# Patient Record
Sex: Female | Born: 1975 | Race: Black or African American | Hispanic: No | Marital: Married | Smoking: Never smoker
Health system: Southern US, Community
[De-identification: ages and names within clinical notes are randomized; demographics above are authoritative.]

## PROBLEM LIST (undated history)

## (undated) DIAGNOSIS — N390 Urinary tract infection, site not specified: Secondary | ICD-10-CM

## (undated) DIAGNOSIS — D649 Anemia, unspecified: Secondary | ICD-10-CM

## (undated) DIAGNOSIS — T7840XA Allergy, unspecified, initial encounter: Secondary | ICD-10-CM

## (undated) DIAGNOSIS — Z87898 Personal history of other specified conditions: Secondary | ICD-10-CM

## (undated) DIAGNOSIS — F32A Depression, unspecified: Secondary | ICD-10-CM

## (undated) DIAGNOSIS — G47419 Narcolepsy without cataplexy: Secondary | ICD-10-CM

## (undated) DIAGNOSIS — G478 Other sleep disorders: Secondary | ICD-10-CM

## (undated) DIAGNOSIS — F329 Major depressive disorder, single episode, unspecified: Secondary | ICD-10-CM

## (undated) HISTORY — PX: WISDOM TOOTH EXTRACTION: SHX21

## (undated) HISTORY — DX: Urinary tract infection, site not specified: N39.0

## (undated) HISTORY — DX: Depression, unspecified: F32.A

## (undated) HISTORY — DX: Major depressive disorder, single episode, unspecified: F32.9

## (undated) HISTORY — DX: Allergy, unspecified, initial encounter: T78.40XA

## (undated) HISTORY — DX: Personal history of other specified conditions: Z87.898

## (undated) HISTORY — DX: Anemia, unspecified: D64.9

---

## 1999-08-04 ENCOUNTER — Other Ambulatory Visit: Admission: RE | Admit: 1999-08-04 | Discharge: 1999-08-04 | Payer: Self-pay | Admitting: *Deleted

## 2000-07-07 ENCOUNTER — Emergency Department (HOSPITAL_COMMUNITY): Admission: EM | Admit: 2000-07-07 | Discharge: 2000-07-08 | Payer: Self-pay | Admitting: Emergency Medicine

## 2000-07-09 ENCOUNTER — Encounter: Admission: RE | Admit: 2000-07-09 | Discharge: 2000-07-09 | Payer: Self-pay | Admitting: Internal Medicine

## 2000-10-08 ENCOUNTER — Other Ambulatory Visit: Admission: RE | Admit: 2000-10-08 | Discharge: 2000-10-08 | Payer: Self-pay | Admitting: Obstetrics and Gynecology

## 2001-04-20 ENCOUNTER — Inpatient Hospital Stay (HOSPITAL_COMMUNITY): Admission: AD | Admit: 2001-04-20 | Discharge: 2001-04-20 | Payer: Self-pay | Admitting: Obstetrics and Gynecology

## 2001-04-22 ENCOUNTER — Inpatient Hospital Stay (HOSPITAL_COMMUNITY): Admission: AD | Admit: 2001-04-22 | Discharge: 2001-04-24 | Payer: Self-pay | Admitting: Obstetrics and Gynecology

## 2001-06-15 ENCOUNTER — Encounter: Payer: Self-pay | Admitting: Emergency Medicine

## 2001-06-15 ENCOUNTER — Emergency Department (HOSPITAL_COMMUNITY): Admission: EM | Admit: 2001-06-15 | Discharge: 2001-06-15 | Payer: Self-pay | Admitting: Emergency Medicine

## 2001-11-29 ENCOUNTER — Other Ambulatory Visit: Admission: RE | Admit: 2001-11-29 | Discharge: 2001-11-29 | Payer: Self-pay | Admitting: Obstetrics and Gynecology

## 2002-12-16 ENCOUNTER — Other Ambulatory Visit: Admission: RE | Admit: 2002-12-16 | Discharge: 2002-12-16 | Payer: Self-pay | Admitting: Family Medicine

## 2003-08-04 ENCOUNTER — Emergency Department (HOSPITAL_COMMUNITY): Admission: AD | Admit: 2003-08-04 | Discharge: 2003-08-04 | Payer: Self-pay | Admitting: Family Medicine

## 2003-10-19 ENCOUNTER — Emergency Department (HOSPITAL_COMMUNITY): Admission: AD | Admit: 2003-10-19 | Discharge: 2003-10-19 | Payer: Self-pay | Admitting: Family Medicine

## 2004-01-26 ENCOUNTER — Ambulatory Visit (HOSPITAL_COMMUNITY): Admission: RE | Admit: 2004-01-26 | Discharge: 2004-01-26 | Payer: Self-pay | Admitting: *Deleted

## 2004-02-01 ENCOUNTER — Other Ambulatory Visit: Admission: RE | Admit: 2004-02-01 | Discharge: 2004-02-01 | Payer: Self-pay | Admitting: Family Medicine

## 2004-02-08 ENCOUNTER — Emergency Department (HOSPITAL_COMMUNITY): Admission: EM | Admit: 2004-02-08 | Discharge: 2004-02-08 | Payer: Self-pay | Admitting: Family Medicine

## 2004-02-23 ENCOUNTER — Emergency Department (HOSPITAL_COMMUNITY): Admission: EM | Admit: 2004-02-23 | Discharge: 2004-02-23 | Payer: Self-pay | Admitting: *Deleted

## 2004-02-26 ENCOUNTER — Emergency Department (HOSPITAL_COMMUNITY): Admission: EM | Admit: 2004-02-26 | Discharge: 2004-02-26 | Payer: Self-pay | Admitting: Family Medicine

## 2004-02-28 ENCOUNTER — Emergency Department (HOSPITAL_COMMUNITY): Admission: EM | Admit: 2004-02-28 | Discharge: 2004-02-28 | Payer: Self-pay | Admitting: Family Medicine

## 2005-05-09 ENCOUNTER — Emergency Department (HOSPITAL_COMMUNITY): Admission: EM | Admit: 2005-05-09 | Discharge: 2005-05-09 | Payer: Self-pay | Admitting: Family Medicine

## 2005-11-07 ENCOUNTER — Emergency Department (HOSPITAL_COMMUNITY): Admission: EM | Admit: 2005-11-07 | Discharge: 2005-11-07 | Payer: Self-pay | Admitting: Family Medicine

## 2006-02-19 ENCOUNTER — Emergency Department (HOSPITAL_COMMUNITY): Admission: EM | Admit: 2006-02-19 | Discharge: 2006-02-19 | Payer: Self-pay | Admitting: Family Medicine

## 2007-01-14 ENCOUNTER — Encounter (INDEPENDENT_AMBULATORY_CARE_PROVIDER_SITE_OTHER): Payer: Self-pay | Admitting: Family Medicine

## 2007-01-14 ENCOUNTER — Ambulatory Visit: Payer: Self-pay | Admitting: Family Medicine

## 2008-03-31 ENCOUNTER — Ambulatory Visit: Payer: Self-pay | Admitting: Family Medicine

## 2008-03-31 DIAGNOSIS — G4710 Hypersomnia, unspecified: Secondary | ICD-10-CM

## 2008-03-31 DIAGNOSIS — R5381 Other malaise: Secondary | ICD-10-CM | POA: Insufficient documentation

## 2008-03-31 DIAGNOSIS — R5383 Other fatigue: Secondary | ICD-10-CM

## 2008-04-01 ENCOUNTER — Encounter (INDEPENDENT_AMBULATORY_CARE_PROVIDER_SITE_OTHER): Payer: Self-pay | Admitting: Family Medicine

## 2008-04-01 LAB — CONVERTED CEMR LAB
ALT: 9 units/L (ref 0–35)
AST: 12 units/L (ref 0–37)
Albumin: 4.6 g/dL (ref 3.5–5.2)
Alkaline Phosphatase: 69 units/L (ref 39–117)
BUN: 10 mg/dL (ref 6–23)
Basophils Absolute: 0 10*3/uL (ref 0.0–0.1)
Basophils Relative: 0 % (ref 0–1)
CO2: 24 meq/L (ref 19–32)
Calcium: 8.9 mg/dL (ref 8.4–10.5)
Chloride: 104 meq/L (ref 96–112)
Creatinine, Ser: 1 mg/dL (ref 0.40–1.20)
Eosinophils Absolute: 0.1 10*3/uL (ref 0.0–0.7)
Eosinophils Relative: 1 % (ref 0–5)
Glucose, Bld: 76 mg/dL (ref 70–99)
HCT: 42.8 % (ref 36.0–46.0)
Hemoglobin: 13.3 g/dL (ref 12.0–15.0)
Lymphocytes Relative: 31 % (ref 12–46)
Lymphs Abs: 2.4 10*3/uL (ref 0.7–4.0)
MCHC: 31.1 g/dL (ref 30.0–36.0)
MCV: 85.3 fL (ref 78.0–100.0)
Monocytes Absolute: 0.5 10*3/uL (ref 0.1–1.0)
Monocytes Relative: 7 % (ref 3–12)
Neutro Abs: 4.7 10*3/uL (ref 1.7–7.7)
Neutrophils Relative %: 62 % (ref 43–77)
Platelets: 337 10*3/uL (ref 150–400)
Potassium: 3.6 meq/L (ref 3.5–5.3)
RBC: 5.02 M/uL (ref 3.87–5.11)
RDW: 13.7 % (ref 11.5–15.5)
Sodium: 140 meq/L (ref 135–145)
TSH: 0.973 microintl units/mL (ref 0.350–4.50)
Total Bilirubin: 0.6 mg/dL (ref 0.3–1.2)
Total Protein: 7.1 g/dL (ref 6.0–8.3)
WBC: 7.7 10*3/uL (ref 4.0–10.5)

## 2008-04-08 ENCOUNTER — Encounter (INDEPENDENT_AMBULATORY_CARE_PROVIDER_SITE_OTHER): Payer: Self-pay | Admitting: Family Medicine

## 2008-04-08 ENCOUNTER — Ambulatory Visit (HOSPITAL_BASED_OUTPATIENT_CLINIC_OR_DEPARTMENT_OTHER): Admission: RE | Admit: 2008-04-08 | Discharge: 2008-04-08 | Payer: Self-pay | Admitting: Family Medicine

## 2008-04-11 ENCOUNTER — Ambulatory Visit: Payer: Self-pay | Admitting: Internal Medicine

## 2008-07-20 ENCOUNTER — Ambulatory Visit: Payer: Self-pay | Admitting: Family Medicine

## 2008-07-20 ENCOUNTER — Other Ambulatory Visit: Admission: RE | Admit: 2008-07-20 | Discharge: 2008-07-20 | Payer: Self-pay | Admitting: Family Medicine

## 2008-07-20 ENCOUNTER — Encounter (INDEPENDENT_AMBULATORY_CARE_PROVIDER_SITE_OTHER): Payer: Self-pay | Admitting: Family Medicine

## 2008-07-20 LAB — CONVERTED CEMR LAB
ALT: 13 units/L (ref 0–35)
AST: 18 units/L (ref 0–37)
Albumin: 4.5 g/dL (ref 3.5–5.2)
Alkaline Phosphatase: 66 units/L (ref 39–117)
BUN: 9 mg/dL (ref 6–23)
Basophils Absolute: 0 10*3/uL (ref 0.0–0.1)
Basophils Relative: 0 % (ref 0–1)
CO2: 24 meq/L (ref 19–32)
Calcium: 9.5 mg/dL (ref 8.4–10.5)
Chloride: 103 meq/L (ref 96–112)
Cholesterol: 180 mg/dL (ref 0–200)
Creatinine, Ser: 0.96 mg/dL (ref 0.40–1.20)
Eosinophils Absolute: 0.1 10*3/uL (ref 0.0–0.7)
Eosinophils Relative: 2 % (ref 0–5)
Glucose, Bld: 74 mg/dL (ref 70–99)
HCT: 41.3 % (ref 36.0–46.0)
HDL: 60 mg/dL (ref >39)
Hemoglobin: 12.8 g/dL (ref 12.0–15.0)
LDL Cholesterol: 106 mg/dL — ABNORMAL HIGH (ref 0–99)
Lymphocytes Relative: 46 % (ref 12–46)
Lymphs Abs: 3.3 10*3/uL (ref 0.7–4.0)
MCHC: 31 g/dL (ref 30.0–36.0)
MCV: 85.3 fL (ref 78.0–100.0)
Monocytes Absolute: 0.4 10*3/uL (ref 0.1–1.0)
Monocytes Relative: 6 % (ref 3–12)
Neutro Abs: 3.4 10*3/uL (ref 1.7–7.7)
Neutrophils Relative %: 47 % (ref 43–77)
Platelets: 323 10*3/uL (ref 150–400)
Potassium: 4.3 meq/L (ref 3.5–5.3)
RBC: 4.84 M/uL (ref 3.87–5.11)
RDW: 13.5 % (ref 11.5–15.5)
Sodium: 143 meq/L (ref 135–145)
TSH: 1.404 microintl units/mL (ref 0.350–4.50)
Total Bilirubin: 0.5 mg/dL (ref 0.3–1.2)
Total CHOL/HDL Ratio: 3
Total Protein: 7.1 g/dL (ref 6.0–8.3)
Triglycerides: 69 mg/dL (ref <150)
VLDL: 14 mg/dL (ref 0–40)
WBC: 7.2 10*3/uL (ref 4.0–10.5)

## 2008-07-27 ENCOUNTER — Encounter (INDEPENDENT_AMBULATORY_CARE_PROVIDER_SITE_OTHER): Payer: Self-pay | Admitting: Family Medicine

## 2008-08-04 ENCOUNTER — Telehealth (INDEPENDENT_AMBULATORY_CARE_PROVIDER_SITE_OTHER): Payer: Self-pay | Admitting: *Deleted

## 2008-11-17 ENCOUNTER — Telehealth (INDEPENDENT_AMBULATORY_CARE_PROVIDER_SITE_OTHER): Payer: Self-pay | Admitting: *Deleted

## 2008-11-26 ENCOUNTER — Encounter (INDEPENDENT_AMBULATORY_CARE_PROVIDER_SITE_OTHER): Payer: Self-pay | Admitting: Family Medicine

## 2008-12-03 ENCOUNTER — Encounter (INDEPENDENT_AMBULATORY_CARE_PROVIDER_SITE_OTHER): Payer: Self-pay | Admitting: Family Medicine

## 2008-12-30 ENCOUNTER — Ambulatory Visit (HOSPITAL_BASED_OUTPATIENT_CLINIC_OR_DEPARTMENT_OTHER): Admission: RE | Admit: 2008-12-30 | Discharge: 2008-12-30 | Payer: Self-pay | Admitting: Neurology

## 2009-01-27 DIAGNOSIS — G47429 Narcolepsy in conditions classified elsewhere without cataplexy: Secondary | ICD-10-CM

## 2009-02-01 ENCOUNTER — Ambulatory Visit: Payer: Self-pay | Admitting: Pulmonary Disease

## 2009-02-03 ENCOUNTER — Encounter (INDEPENDENT_AMBULATORY_CARE_PROVIDER_SITE_OTHER): Payer: Self-pay | Admitting: Family Medicine

## 2009-02-05 ENCOUNTER — Encounter (INDEPENDENT_AMBULATORY_CARE_PROVIDER_SITE_OTHER): Payer: Self-pay | Admitting: Family Medicine

## 2009-02-09 ENCOUNTER — Encounter (INDEPENDENT_AMBULATORY_CARE_PROVIDER_SITE_OTHER): Payer: Self-pay | Admitting: Family Medicine

## 2009-11-09 ENCOUNTER — Emergency Department (HOSPITAL_COMMUNITY): Admission: EM | Admit: 2009-11-09 | Discharge: 2009-11-10 | Payer: Self-pay | Admitting: Emergency Medicine

## 2010-02-23 ENCOUNTER — Emergency Department (HOSPITAL_COMMUNITY): Admission: EM | Admit: 2010-02-23 | Discharge: 2010-02-23 | Payer: Self-pay | Admitting: Family Medicine

## 2010-03-10 ENCOUNTER — Ambulatory Visit: Payer: Self-pay | Admitting: Physician Assistant

## 2010-03-20 ENCOUNTER — Emergency Department (HOSPITAL_COMMUNITY): Admission: EM | Admit: 2010-03-20 | Discharge: 2010-03-20 | Payer: Self-pay | Admitting: Emergency Medicine

## 2010-04-04 ENCOUNTER — Encounter: Payer: Self-pay | Admitting: Physician Assistant

## 2010-04-05 ENCOUNTER — Other Ambulatory Visit: Admission: RE | Admit: 2010-04-05 | Discharge: 2010-04-05 | Payer: Self-pay | Admitting: Internal Medicine

## 2010-04-05 ENCOUNTER — Ambulatory Visit: Payer: Self-pay | Admitting: Physician Assistant

## 2010-04-05 DIAGNOSIS — R82998 Other abnormal findings in urine: Secondary | ICD-10-CM | POA: Insufficient documentation

## 2010-04-05 LAB — CONVERTED CEMR LAB
KOH Prep: NEGATIVE
Nitrite: NEGATIVE
Urobilinogen, UA: 0.2
WBC Urine, dipstick: NEGATIVE
pH: 6.5

## 2010-04-08 DIAGNOSIS — B373 Candidiasis of vulva and vagina: Secondary | ICD-10-CM

## 2010-04-12 ENCOUNTER — Ambulatory Visit (HOSPITAL_COMMUNITY): Admission: RE | Admit: 2010-04-12 | Discharge: 2010-04-12 | Payer: Self-pay | Admitting: Internal Medicine

## 2010-04-19 ENCOUNTER — Encounter: Payer: Self-pay | Admitting: Physician Assistant

## 2010-04-26 ENCOUNTER — Ambulatory Visit: Payer: Self-pay | Admitting: Physician Assistant

## 2010-04-26 LAB — CONVERTED CEMR LAB
Blood in Urine, dipstick: NEGATIVE
Protein, U semiquant: NEGATIVE
Urobilinogen, UA: 0.2
WBC Urine, dipstick: NEGATIVE

## 2010-10-18 NOTE — Progress Notes (Signed)
Summary: Office Visit//DEPRESSION SCREENING  Office Visit//DEPRESSION SCREENING   Imported By: Arta Bruce 04/11/2010 12:34:04  _____________________________________________________________________  External Attachment:    Type:   Image     Comment:   External Document

## 2010-10-18 NOTE — Letter (Signed)
Summary: *HSN Results Follow up  [facility]  [address] [place], [place] [address]   Phone: [phone]  Fax: [phone]      04/26/2010   [facility] [address] [place], [place]  [address]   Dear  Ms. Jane Eaton,                            ____S.Drinkard,FNP   ____D. Gore,FNP       ____B. McPherson,MD   ____V. Rankins,MD    ____E. Mulberry,MD    ____N. Daphine Deutscher, FNP  ____D. Reche Dixon, MD    ____K. Philipp Deputy, MD    __x__S. Alben Spittle, PA-C     This letter is to inform you that your recent test(s):  _______Pap Smear    ____x___Lab Test     _______X-ray    ___x____ is within acceptable limits  _______ requires a medication change  _______ requires a follow-up lab visit  _______ requires a follow-up visit with your provider   Comments:  Urinalysis was normal.       _________________________________________________________ If you have any questions, please contact our office                     Sincerely,  Tereso Newcomer PA-C [facility]

## 2010-10-18 NOTE — Assessment & Plan Note (Signed)
Summary: Establish with Jane Eaton   Vital Signs:  Patient profile:   35 year old female Height:      60.5 inches Weight:      128 pounds BMI:     24.68 Temp:     98 degrees F oral Pulse rate:   88 / minute Pulse rhythm:   regular Resp:     16 per minute BP sitting:   116 / 69  (left arm) Cuff size:   regular  Vitals Entered By: Arm[place] Shannon (March 10, 2010 2:33 PM) CC: appointment to establish new physician Is Patient Diabetic? No Pain Assessment Patient in pain? no       Does patient need assistance? Functional Status Self care Ambulation Normal   Primary Care Provider:  Tereso Newcomer, PA-C  CC:  appointment to establish new physician.  History of Present Illness: Here to meet with me.  Prior patient of Dr. Barbaraann Barthel.  Narcolepsy:  Supposed to see Dr. Vickey Huger this year.  But, no f/u made yet.  Having trouble with their office and scheduling.  She has not had her medicine refilled either.  Health Maint:   States IUD fell out recently.  Placed in 2008 (good for 5 years) - Mirena She describes the IUD. Had some vaginal bleeding for 5 days.  No further bleeding. Td  shot done 2004  Denies any cramping, bleeding or discharge.  No fevers or chills. She is thinking about getting pregnant now that her IUD has fallen out.   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 Take 1/2 Tablet By Mouth  Every Morning and 1/2 Tablet As Needed Pm (Prescribed Per Dr.dohmeier).  Allergies (verified): No Known Drug Allergies  Past History:  Past Medical History: Vitiligo  04/09/07 Hirsuitism sees dr.Lupton Narcolepsy   a.  Dr. Vickey Huger h/o anemia ? h/o seizures as a child (followed by neuro)  Past Surgical History: Caesarean section (2001) 4 wisdom teeth extracted  Family History: Family History Diabetes 1st degree relative CAD - father died with MI age 61 No colon, breast or ovarian cancer  Social History: Married 2 kids Never Smoked Alcohol use-yes;rare use 1x/mo Drug use-no Works at Insurance underwriter at [employer] [place]  Review of Systems      See HPI General:  Denies chills and fever. CV:  Denies chest pain or discomfort. Resp:  Denies cough.  Physical Exam  General:  alert, well-developed, and well-nourished.   Head:  normocephalic and atraumatic.   Neck:  supple.   Lungs:  normal breath sounds.   Heart:  normal rate and regular rhythm.   Abdomen:  soft and non-tender.   Extremities:  no edema  Neurologic:  alert & oriented X3 and cranial nerves II-XII intact.   Psych:  normally interactive and good eye contact.     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 Take 1/2 tablet by mouth  every morning and 1/2 tablet as needed pm (prescribed per dr.dohmeier).  Patient Instructions: 1)  Please schedule a follow-up appointment in 3-4 weeks for CPP.

## 2010-10-18 NOTE — Letter (Signed)
Summary: TEST ORDER FORM/ULTRASOUND  TEST ORDER FORM/ULTRASOUND   Imported By: Arta Bruce 04/12/2010 10:15:54  _____________________________________________________________________  External Attachment:    Type:   Image     Comment:   External Document

## 2010-10-18 NOTE — Assessment & Plan Note (Signed)
Summary: CPP   Vital Signs:  Patient profile:   35 year old female Height:      60.5 inches Weight:      130 pounds BMI:     25.06 Temp:     98.1 degrees F oral Pulse rate:   83 / minute Pulse rhythm:   regular Resp:     18 per minute BP sitting:   111 / 71  (left arm) Cuff size:   regular  Vitals Entered By: Arm[place] Shannon (April 05, 2010 3:23 PM) CC: cpp Is Patient Diabetic? No Pain Assessment Patient in pain? no       Does patient need assistance? Functional Status Self care Ambulation Normal   Primary Care Provider:  Tereso Newcomer, PA-C  CC:  cpp.  History of Present Illness: Here for CPP. PHQ9=6.  Declines seeing counselor. No h/o abnormal paps. LMP started few days ago. IUD fell out before initial appt with me. Period just lasted a couple days whereas it would last about 5 days in the past. She states she is not sure if all of IUD came out. She states her husband has an injury to his penis (like a papercut). Her periods were very light with the IUD.  She might miss a month but it would come back the next month.  No vaginal discharge, burning or itching. Does not take calcium.     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 Take 1/2 Tablet By Mouth  Every Morning and 1/2 Tablet As Needed Pm (Prescribed Per Dr.dohmeier).  Allergies (verified): No Known Drug Allergies  Past History:  Past Medical History: Last updated: 03/10/2010 Vitiligo 04/09/07 Hirsuitism sees dr.Lupton Narcolepsy   a.  Dr. Vickey Huger h/o anemia ? h/o seizures as a child  (followed by neuro)  Past Surgical History: Last updated: 03/10/2010 Caesarean section (2001) 4 wisdom teeth extracted  Family History: Reviewed history from 03/10/2010 and no changes required. Family History Diabetes 1st degree relative CAD - father died with MI age 29 No colon, breast or ovarian cancer  Social History: Reviewed history from 03/10/2010 and no changes required. Married 2 kids Never Smoked Alcohol use-yes;rare use 1x/mo Drug use-no Works at Insurance underwriter at [employer] [place]  Review of Systems      See HPI General:  Denies chills and fever. CV:  Denies chest pain or discomfort and shortness of breath with exertion. Resp:  Denies cough. GI:  Denies bloody stools. GU:  Denies hematuria. MS:  Denies joint pain. Derm:  h/o vitiligo. Neuro:  Denies headaches. Psych:  Denies depression and suicidal thoughts/plans. Heme:  Denies bleeding.  Physical Exam  General:  alert, well-developed, and well-nourished.   Head:  normocephalic and atraumatic.   Eyes:  pupils equal, pupils round, and pupils reactive to light.   Ears:  R ear normal and L ear normal.   Nose:  no external deformity.   Mouth:  pharynx pink and moist.   Neck:  supple, no thyromegaly, and no cervical lymphadenopathy.   Breasts:  skin/areolae normal, no masses, no abnormal thickening, no nipple discharge, no tenderness, and no adenopathy.  Lungs:  normal breath sounds, no crackles, and no wheezes.   Heart:  normal rate and regular rhythm.   Abdomen:  soft, non-tender, normal bowel sounds, and no hepatomegaly.   Rectal:  no external abnormalities.   Genitalia:  normal introitus, no external lesions, no vaginal discharge, mucosa pink and moist, no vaginal or cervical lesions, no vaginal atrophy, no friaility or hemorrhage, normal uterus size and position, and no adnexal masses or tenderness.   Msk:  normal ROM.   Extremities:  no edema Neurologic:  alert & oriented X3 and cranial nerves II-XII  intact.   Skin:  loss of pigment noted around mouth  Psych:  normally interactive.     Impression & Recommendations:  Problem # 1:  EXAMINATION, ROUTINE MEDICAL (ICD-V70.0)  Orders: T-Pap Smear, Thin Prep ([address]) KOH/ WET Mount [phone]) T-Urinalysis [phone])  Problem # 2:  FAMILY PLANNING (ICD-V25.09)  no string seen on exam patient still uncertain if entire IUD came out will send for ultrasound to see if any retained IUD  if so, will have her see GYN  Orders: Ultrasound (Ultrasound)  Problem # 3:  URINALYSIS, ABNORMAL (ICD-791.9) trace intact blood  repeat u/a in 3 weeks  Complete Medication List: 1)  Nuvigil 250 Mg Tabs (Armodafinil) .... Take 1/2 tablet by mouth  every morning and 1/2 tablet as needed pm (prescribed per dr.dohmeier).  Patient Instructions: 1)  Return to lab in 3 weeks for repeat U/A. 2)  Schedule CPP with Santiago Stenzel in one year.  Laboratory Results   Urine Tests    Routine Urinalysis   Glucose: negative   (Normal Range: Negative) Bilirubin: negative   (Normal Range: Negative) Ketone: negative   (Normal Range: Negative) Spec. Gravity: 1.015   (Normal Range: 1.003-1.035) Blood: trace-intact   (Normal Range: Negative) pH: 6.5   (Normal Range: 5.0-8.0) Protein: negative   (Normal Range: Negative) Urobilinogen: 0.2   (Normal Range: 0-1) Nitrite: negative   (Normal Range: Negative) Leukocyte Esterace: negative   (Normal Range: Negative)      Wet Mount Source: vaginal WBC/hpf: 1-5 Bacteria/hpf: 1+ Clue cells/hpf: none  Negative whiff Yeast/hpf: none Wet Mount KOH: Negative Trichomonas/hpf: none

## 2010-10-18 NOTE — Letter (Signed)
Summary: *HSN Results Follow up  [facility]  [address] [place], [place] [address]   Phone: [phone]  Fax: [phone]      04/19/2010   [facility] [place] [address] [place], [place]  [address]   Dear  Ms. Arilyn Sitar,                            ____S.Drinkard,FNP   ____D. Gore,FNP       ____B. McPherson,MD   ____V. Rankins,MD    ____E. Mulberry,MD    ____N. Daphine Deutscher, FNP  ____D. Reche Dixon, MD    ____K. Philipp Deputy, MD    _x___S. Alben Spittle, PA-C     This letter is to inform you that your recent test(s):  _______Pap Smear    _______Lab Test     ___x____Ultrasound    _______ is normal  _______ requires a medication change  _______ requires a follow-up lab visit  _______ requires a follow-up visit with your provider   Comments: Your IUD is completely out.       _________________________________________________________ If you have any questions, please contact our office                     Sincerely,  Tereso Newcomer PA-C [facility]

## 2010-12-04 LAB — POCT PREGNANCY, URINE: Preg Test, Ur: NEGATIVE

## 2010-12-07 LAB — RAPID STREP SCREEN (MED CTR MEBANE ONLY): Streptococcus, Group A Screen (Direct): NEGATIVE

## 2011-01-31 NOTE — Procedures (Signed)
NAMELADREA, HOLLADAY            ACCOUNT NO.:  [id number]   MEDICAL RECORD NO.:  [id number]          PATIENT TYPE:  OUT   LOCATION:  SLEEP CENTER                 FACILITY:  [facility]   PHYSICIAN:  Clinton D. Maple Hudson, MD, FCCP, FACPDATE OF BIRTH:  1978-08-25   DATE OF STUDY:  04/08/2008                            NOCTURNAL POLYSOMNOGRAM   REFERRING PHYSICIAN:  Turkey R. Rankins, M.D.   INDICATION FOR STUDY:  Hypersomnia with sleep apnea.   EPWORTH SLEEPINESS SCORE:  10/24, BMI 24.6, weight 126 pounds, neck 12.5  inches.   MEDICATIONS:  None listed.   SLEEP ARCHITECTURE:  Total sleep time 306 minutes with sleep efficiency  81.5%.  Stage I was 6.7%, stage II 74.6%, stage III 0.5%, REM 18.3% of  total sleep time.  Sleep latency 25 minutes.  REM latency 95 minutes.  Awake after sleep onset 43 minutes.  Arousal index 10.2.  No bedtime  medication was taken.   RESPIRATORY DATA:  Apnea/hypopnea index 0 per hour.  No respiratory  events, meeting score and criteria were observed.   OXYGEN DATA:  Minimal snoring occasionally.  Oxygen desaturation to a  nadir of 95%.  Mean oxygen saturation through the study was 97.5% on  room air.   CARDIAC DATA:  Normal sinus rhythm.   MOVEMENT-PARASOMNIA:  No significant movement disturbance effecting  sleep.  No bathroom trips.   IMPRESSIONS-RECOMMENDATIONS:  1. No respiratory events disturbing sleep.  AHI 0 per hour with      minimal snoring and normal oxygenation, desaturating to a nadir of      95%.  2. The patient had complained of daytime hypersomnia.  Sleep      architecture on this study is unremarkable, although, she began      waking around 4 a.m.  Suggest discussing home sleep environment and      habits and asking for symptoms suggestive of sleep paralysis or      cataplexy.  If there is concern for primary hypersomnia,      idiopathic or narcolepsy, consider return for daytime study,      multiple sleep latency test for objective  evaluation.      Clinton D. Maple Hudson, MD, [facility], FACP  Diplomate, Biomedical engineer of Sleep Medicine  Electronically Signed     CDY/MEDQ  D:  04/11/2008 12:02:27  T:  04/11/2008 13:43:35  Job:  [id number]

## 2011-02-03 NOTE — Procedures (Signed)
Jane Eaton, Jane Eaton            ACCOUNT NO.:  [id number]   MEDICAL RECORD NO.:  [id number]          PATIENT TYPE:  OUT   LOCATION:  SLEEP CENTER                 FACILITY:  [facility]   PHYSICIAN:  Barbaraann Share, MD,FCCPDATE OF BIRTH:  29-[birth year]-1978   DATE OF STUDY:                          MULTIPLE SLEEP LATENCY TEST   REFERRING PHYSICIAN:  Melvyn Novas, M.D.   REFERRING PHYSICIAN:  Melvyn Novas, MD   INDICATIONS FOR THE STUDY:  Excessive daytime sleepiness of unknown  etiology.   EPWORTH SCORE:  13.   Nap #1, sleep onset latency of 20 minutes and REM onset N/A.  Nap #2,  sleep onset latency of 5 minutes and REM onset N/A.  Nap #3, sleep onset  latency of 15.5 minutes and REM onset N/A.  Nap #4, sleep onset latency  of 3 minutes and REM onset N/A.  Nap #5, sleep onset latency of 3  minutes and REM onset N/A.  Mean sleep latency is 9.3 minutes.   COMMENTS:  It should be noted the patient had a sleep study in July  2009, which showed no sleep-disordered breathing.  The patient did not  bring sleep logs nor a medication log to her study.   IMPRESSION/RECOMMENDATION:  1. MSLT reveals some degree of inappropriate daytime sleepiness with a      mean sleep onset latency of 9.3 minutes, however REM was not      achieved during any of the naps.  Clinical correlation is suggested      to establish the significance of the results.  2. No significant snoring noted.      Barbaraann Share, MD,FCCP  Diplomate, American Board of Sleep  Medicine  Electronically Signed     [facility]/MEDQ  D:  02/01/2009 08:14:44  T:  02/01/2009 22:42:30  Job:  [id number]

## 2011-02-03 NOTE — H&P (Signed)
[facility]  Patient:    KAOIR, LOREE                     MRN: [id number] Adm. Date:  [id number] Attending:  Shaune Spittle Dictator:   Nigel Bridgeman, C.N.M.                         History and Physical  DATE OF BIRTH:                10-02-1975  HISTORY OF PRESENT ILLNESS:   Ms. Emmer is a 35 year old, gravida 2, para 1-0-0-1 at 39-4/7ths weeks who presents with uterine contractions every three minutes times several hours.  She was seen at the office today, the cervix 3+ cm, but had been 3 cm for the last 1-1/2 weeks.  She was sent to the maternity admissions unit for ambulation and a recheck.  Cervix upon recheck was 4 cm, 80% vertex, -1 to - station, with uterine contractions every two to four minutes of moderate to strong quality.  She was therefore admitted to the birthing suite for further labor care.  Pregnancy has been remarkable for: 1. Previous C section with the desire for VBAC.  2. Second pregnancy in 12 months.  3. History of Chlamydia.  4. Family history of mental mental retardation.  5. Frequent UTIs this pregnancy.  PRENATAL LABS:                Blood type is B positive, Rh antibody negative, VDRL nonreactive, rubella titer positive, hepatitis B surface antigen negative, HIV negative, sickle cell test negative.  Pap was normal except for inflammatory changes.  GC and Chlamydia cultures were negative.  Pap was normal on repeat except for yeast.  Urine culture in the initial part of pregnancy was positive for E. coli.  AST was normal.  Glucose challenge was normal.  Hemoglobin upon entering the practice was 13.1.  It was 11.3 at 27 weeks.  Group B strep culture was negative at 36 weeks.  EDC of May 02, 2001, was established by last menstrual period and was in agreement from ultrasound for approximately 18 weeks.  HISTORY OF PRESENT PREGNANCY:    Patient entered care at approximately 10 weeks.  She had a urine  culture with E. coli in the first trimester that was treated with Macrobid.  That did resolve; however, at 20 weeks, it reoccurred and she was treated again with Macrobid.  Urine was sent for culture.  She had a negative urine culture at 30 weeks.  She was tested again at 33 weeks secondary to no symptoms but questionably nitrite positive.  E. coli was noted on that culture and she was placed on Septra b.i.d. x 7 days.  She was seen in the maternity admissions unit on August 3 for labor check and was 3 cm.  OBSTETRICAL HISTORY:          In May 2001, she had a primary low-transverse cesarean section at [facility] for a viable female, weight 7 pounds, 5 ounces at 39 weeks.  She was in labor 24 hours.  She became completely dilated and pushed for four hours.  Her perineum became swollen and there were decelerations on the fetal heart rate.  She was diagnosed with Chlamydia in her pregnancy in 2001 and was treated.  She also had a yeast infection while pregnancy.  She reports the usual childhood illnesses.  She  has a history of anemia.  Her only other surgery history is only for the C section in 2001. She has no medication allergies.  FAMILY HISTORY:               Her mother had a history of hypertension but that has now resolved.  Her maternal grandfather had emphysema.  Her father had type 2 diabetes on oral control.  Her father was a smoker.  GENETIC HISTORY:              Remarkable for her brother being mentally retarded.  SOCIAL HISTORY:               Patient has recently been married to [employer].  She is African-American and is of nondinominational [place] faith.  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  Her husband has one year of college.  He is employed as a Airline pilot.  She has been followed by the certified nurse midwife service at [facility].  She denies any alcohol, drug, or tobacco use during this pregnancy.  PHYSICAL EXAMINATION:  VITAL  SIGNS:                  Stable.  Patient is afebrile.  HEENT:                        Within normal limits.  LUNGS:                        Bilateral breath sounds are clear.  HEART:                        Regular rate and rhythm without murmur.  BREASTS:                      Soft and nontender.  ABDOMEN:                      Fundal height is approximately 37 cm.  Estimated fetal weight is 7 to 7-1/2 pounds.  Uterine contractions are every two to four minutes, of moderate to strong quality.  PELVIC:                       Cervical exam - 4 cm, 80%, vertex at a -1 to 0 station with an intact bag of water.  Fetal heart rate is reactive with a negative spontaneous CST.  EXTREMITIES:                  Deep tendon reflexes are 2+ without clonus. There is a trace edema noted.  IMPRESSION:                   1. Intrauterine pregnancy at 38-4/7ths weeks.                               2. Active labor.                               3. Previous cesarean section with a desire for vaginal birth after cesarean.                               4.  Closely spaced pregnancies.                               5. History of frequent urinary tract infections                                  this pregnancy.  PLAN:                         1. Admit to the birthing suite for consult with                                  Dr. Dierdre Forth as the attending                                  physician.                               2. Patient desires vaginal birth after cesarean.                                  The operative note was reviewed which                                  documented a primary low-transverse                                  cesarean section done at [facility]                                  with a single layer of closure on the uterus.                                  I reviewed this finding with Dr. Pennie Rushing.                                  The patients risk of uterine rupture  in                                  light of her relatively closely spaced                                  pregnancy is approximately 3%.  I did review                                   this with the patient and her husband  including the risks of compromised maternal                                  fetal unit as well as failure to progress and                                  failure to descend as risks.  They do wish to                                  proceed with vaginal birth after cesarean                                  attempt.                               3. Placement of intrauterine pressure catheter                                  and fetal scalp electrode upon rupture of                                  membranes.  If that does not occur                                  spontaneously within the next hour, I will                                  anticipate artificial rupture of membranes.                               4. Close monitoring of maternal fetal status                                  will continue.                               5. The patient desires epidural.  This was                                  okayed in consultation with Dr. Dierdre Forth as the attending physician. DD:  04/22/01 TD:  04/22/01 Job: [id number]

## 2012-07-10 ENCOUNTER — Encounter: Payer: Self-pay | Admitting: Obstetrics and Gynecology

## 2013-09-20 ENCOUNTER — Emergency Department (INDEPENDENT_AMBULATORY_CARE_PROVIDER_SITE_OTHER)
Admission: EM | Admit: 2013-09-20 | Discharge: 2013-09-20 | Disposition: A | Discharge status: Home / Self Care | Payer: Medicaid Other | Source: Home / Self Care

## 2013-09-20 ENCOUNTER — Encounter (HOSPITAL_COMMUNITY): Payer: Self-pay | Admitting: Emergency Medicine

## 2013-09-20 DIAGNOSIS — Z3490 Encounter for supervision of normal pregnancy, unspecified, unspecified trimester: Secondary | ICD-10-CM

## 2013-09-20 DIAGNOSIS — Z3201 Encounter for pregnancy test, result positive: Secondary | ICD-10-CM

## 2013-09-20 DIAGNOSIS — M549 Dorsalgia, unspecified: Secondary | ICD-10-CM

## 2013-09-20 LAB — POCT PREGNANCY, URINE: PREG TEST UR: POSITIVE — AB

## 2013-09-20 LAB — POCT URINALYSIS DIP (DEVICE)
Bilirubin Urine: NEGATIVE
Glucose, UA: NEGATIVE mg/dL
KETONES UR: NEGATIVE mg/dL
Leukocytes, UA: NEGATIVE
Nitrite: NEGATIVE
PROTEIN: NEGATIVE mg/dL
SPECIFIC GRAVITY, URINE: 1.025 (ref 1.005–1.030)
Urobilinogen, UA: 0.2 mg/dL (ref 0.0–1.0)
pH: 6 (ref 5.0–8.0)

## 2013-09-20 NOTE — ED Notes (Addendum)
C/o lower back pain since 12/22 States years ago 2008 her kidneys was going to shut down Denies vaginal discharge, abd pain, or urinary problems Denies any injury

## 2013-09-20 NOTE — ED Provider Notes (Signed)
CSN: [id number]     Arrival date & time 09/20/13  1039 History   None    Chief Complaint  Patient presents with  . Back Pain   (Consider location/radiation/quality/duration/timing/severity/associated sxs/prior Treatment)  HPI  The patient presents today with reports of lower back pain since December 23. Patient denies any known injury. She is concerned because she has had a history of urinary tract infections for which her "kidneys almost shut down".  The patient denies any urinary frequency, urgency or dysuria.    History reviewed. No pertinent past medical history. No past surgical history on file. No family history on file. History  Substance Use Topics  . Smoking status: Not on file  . Smokeless tobacco: Not on file  . Alcohol Use: Not on file   OB History   Grav Para Term Preterm Abortions TAB SAB Ect Mult Living                 Review of Systems  Constitutional: Negative.   HENT: Negative.   Eyes: Negative.   Respiratory: Negative.   Cardiovascular: Negative.   Gastrointestinal: Negative.   Endocrine: Negative.   Genitourinary: Negative.   Musculoskeletal: Positive for back pain.  Skin: Negative.   Allergic/Immunologic: Negative.   Neurological: Negative.   Hematological: Negative.   Psychiatric/Behavioral: Negative.     Allergies  Review of patient's allergies indicates no known allergies.  Home Medications  No current outpatient prescriptions on file. BP 126/61  Pulse 94  Temp(Src) 98.4 F (36.9 C) (Oral)  Resp 18  SpO2 100%  LMP 09/18/2013  Physical Exam  Nursing note and vitals reviewed. Constitutional: She is oriented to person, place, and time. She appears well-developed and well-nourished. No distress.  HENT:  Head: Normocephalic and atraumatic.  Neck: Normal range of motion. Neck supple.  No nuchal rigidity.  Cardiovascular: Normal rate, regular rhythm, normal heart sounds and intact distal pulses.  Exam reveals no gallop and no friction  rub.   No murmur heard. Pulmonary/Chest: Effort normal and breath sounds normal. No respiratory distress. She has no wheezes. She has no rales. She exhibits no tenderness.  Abdominal: Soft. Bowel sounds are normal. She exhibits no distension and no mass. There is no tenderness. There is no rebound and no guarding.  Negative CVAT tenderness.  Musculoskeletal: Normal range of motion. She exhibits tenderness. She exhibits no edema.       Lumbar back: She exhibits tenderness.       Back:  Neurological: She is alert and oriented to person, place, and time. She has normal reflexes. She displays normal reflexes. No cranial nerve deficit. She exhibits normal muscle tone. Coordination normal.  Heel to toe gait intact.  Negative Romberg or pronator drift.  Skin: Skin is warm and dry. She is not diaphoretic.  Psychiatric: She has a normal mood and affect. Her behavior is normal.    ED Course  Procedures (including critical care time) Labs Review Labs Reviewed  POCT URINALYSIS DIP (DEVICE) - Abnormal; Notable for the following:    Hgb urine dipstick TRACE (*)    All other components within normal limits  POCT PREGNANCY, URINE - Abnormal; Notable for the following:    Preg Test, Ur POSITIVE (*)    All other components within normal limits   Urine is negative for nitrates. Lack of presence of UTI symptoms makes urinary tract infection diagnosis doubtful.  Imaging Review No results found.  MDM   1. Pregnancy    [facility] of her [place] for prenatal care patient is 38 years of age/considered high risk prenatal.  The patient to begin prenatal vitamins with folic acid starting today.    Jacqualyn Posey, NP 09/20/13 [place], NP 09/20/13 1553
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
## 2013-09-23 NOTE — ED Provider Notes (Signed)
Medical screening examination/treatment/procedure(s) were performed by resident physician or non-physician practitioner and as supervising physician I was immediately available for consultation/collaboration.   Pauline Good MD.   Billy Fischer, MD 09/23/13 [phone]

## 2016-11-06 ENCOUNTER — Ambulatory Visit (HOSPITAL_COMMUNITY)
Admission: EM | Admit: 2016-11-06 | Discharge: 2016-11-06 | Disposition: A | Discharge status: Home / Self Care | Payer: BLUE CROSS/BLUE SHIELD | Attending: Internal Medicine | Admitting: Internal Medicine

## 2016-11-06 ENCOUNTER — Encounter (HOSPITAL_COMMUNITY): Payer: Self-pay | Admitting: Emergency Medicine

## 2016-11-06 DIAGNOSIS — M5440 Lumbago with sciatica, unspecified side: Secondary | ICD-10-CM

## 2016-11-06 DIAGNOSIS — T148XXA Other injury of unspecified body region, initial encounter: Secondary | ICD-10-CM | POA: Diagnosis not present

## 2016-11-06 MED ORDER — CYCLOBENZAPRINE HCL 5 MG PO TABS: 5.0000 mg | ORAL_TABLET | Freq: Every day | ORAL | 0 refills | Status: DC

## 2016-11-06 MED ORDER — NAPROXEN 500 MG PO TABS: 500.0000 mg | ORAL_TABLET | Freq: Two times a day (BID) | ORAL | 0 refills | Status: DC

## 2016-11-06 MED ORDER — OMEPRAZOLE 20 MG PO CPDR: 20.0000 mg | DELAYED_RELEASE_CAPSULE | Freq: Every day | ORAL | 0 refills | Status: DC

## 2016-11-06 NOTE — ED Provider Notes (Signed)
CSN: [id number]     Arrival date & time 11/06/16  1705 History   First MD Initiated Contact with Patient 11/06/16 1846     Chief Complaint  Patient presents with  . 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  She had to stop working today and came in to get checked.  Patient reports having some GI sx's after taking an aleve on empty stomach today.   The history is provided by the patient.  Back Pain  Location:  Lumbar spine Quality:  Aching Radiates to:  R posterior upper leg Pain severity:  Moderate Pain is:  Worse during the day Onset quality:  Sudden Timing:  Constant Progression:  Worsening Chronicity:  New Relieved by:  Nothing Worsened by:  Nothing Ineffective treatments:  None tried   History reviewed. No pertinent past medical history. Past Surgical History:  Procedure Laterality Date  . CESAREAN SECTION     History reviewed. No pertinent family history. Social History  Substance Use Topics  . Smoking status: Never Smoker  . Smokeless tobacco: Never Used  . Alcohol use No   OB History    No data available     Review of Systems  Constitutional: Negative.   HENT: Negative.   Eyes: Negative.   Respiratory: Negative.   Cardiovascular: Negative.   Gastrointestinal: Negative.   Endocrine: Negative.   Genitourinary: Negative.   Musculoskeletal: Positive for back pain.  Allergic/Immunologic: Negative.   Neurological: Negative.   Hematological: Negative.   Psychiatric/Behavioral: Negative.     Allergies  Patient has no known allergies.  Home Medications   Prior to  Admission medications   Medication Sig Start Date End Date Taking? Authorizing Provider  ergocalciferol (VITAMIN D2) 50000 units capsule Take 50,000 Units by mouth once a week.   Yes Historical Provider, MD  Prenatal Vit-Fe Fumarate-FA (PRENATAL MULTIVITAMIN) TABS tablet Take 1 tablet by mouth daily at 12 noon.   Yes Historical Provider, MD  cyclobenzaprine (FLEXERIL) 5 MG tablet Take 1 tablet (5 mg total) by mouth at bedtime. 11/06/16   Lysbeth Penner, FNP  naproxen (NAPROSYN) 500 MG tablet Take 1 tablet (500 mg total) by mouth 2 (two) times daily with a meal. 11/06/16   Lysbeth Penner, FNP  omeprazole (PRILOSEC) 20 MG capsule Take 1 capsule (20 mg total) by mouth daily. 11/06/16   Lysbeth Penner, FNP   Meds Ordered and Administered this Visit  Medications - No data to display  BP (!) 108/52 (BP Location: Right Arm)   Pulse 81   Temp 98.6 F (37 C) (Oral)   Resp 16   LMP 10/23/2016 (Approximate)   SpO2 100%  No data found.   Physical Exam  Constitutional: She appears well-developed and well-nourished.  HENT:  Head: Normocephalic and atraumatic.  Right Ear: External ear normal.  Left Ear: External ear normal.  Mouth/Throat: Oropharynx is clear and moist.  Eyes: Conjunctivae and EOM are normal. Pupils are equal, round, and reactive to light.  Neck: Normal range of motion. Neck supple.  Cardiovascular: Normal rate, regular rhythm and normal heart sounds.   Pulmonary/Chest: Effort normal and breath sounds normal.  Abdominal:  Soft. Bowel sounds are normal.  Musculoskeletal: She exhibits tenderness.  Right LS muscles tender and decreased ROM LS spine.  Neg SLR bilateral.  Nursing note and vitals reviewed.   Urgent Care Course     Procedures (including critical care time)  Labs Review Labs Reviewed - No data to display  Imaging Review No results found.   Visual Acuity Review  Right Eye Distance:   Left Eye Distance:   Bilateral Distance:    Right Eye Near:   Left  Eye Near:    Bilateral Near:         MDM   1. Acute right-sided low back pain with sciatica, sciatica laterality unspecified   2. Muscle strain    Prilosec 20mg  one po qd #14 Naprosyn 500mg  one po bid x 10 days #20 Flexeril 5mg  one po qhs #10 Work note    Lysbeth Penner, Luis M. Cintron 11/06/16 [phone]

## 2016-11-06 NOTE — ED Triage Notes (Signed)
The patient presented to the [facility] with a complaint of back pain that radiates into her left leg x 1 week. The patient denied any known injury.

## 2017-02-26 ENCOUNTER — Encounter: Payer: Self-pay | Admitting: Family Medicine

## 2017-02-26 ENCOUNTER — Ambulatory Visit (INDEPENDENT_AMBULATORY_CARE_PROVIDER_SITE_OTHER): Payer: BLUE CROSS/BLUE SHIELD | Admitting: Family Medicine

## 2017-02-26 VITALS — BP 142/88 | HR 74 | Temp 98.4°F | Ht 61.0 in | Wt 132.2 lb

## 2017-02-26 DIAGNOSIS — E162 Hypoglycemia, unspecified: Secondary | ICD-10-CM | POA: Diagnosis not present

## 2017-02-26 DIAGNOSIS — Z7689 Persons encountering health services in other specified circumstances: Secondary | ICD-10-CM | POA: Diagnosis not present

## 2017-02-26 DIAGNOSIS — Z8639 Personal history of other endocrine, nutritional and metabolic disease: Secondary | ICD-10-CM

## 2017-02-26 LAB — BASIC METABOLIC PANEL
BUN: 14 mg/dL (ref 6–23)
CO2: 28 mEq/L (ref 19–32)
CREATININE: 0.91 mg/dL (ref 0.40–1.20)
Calcium: 9.7 mg/dL (ref 8.4–10.5)
Chloride: 102 mEq/L (ref 96–112)
GFR: 88.55 mL/min (ref >60.00)
GLUCOSE: 75 mg/dL (ref 70–99)
Potassium: 3.6 mEq/L (ref 3.5–5.1)
Sodium: 138 mEq/L (ref 135–145)

## 2017-02-26 NOTE — Progress Notes (Addendum)
Patient ID: Jane Eaton, female   DOB: 1976-02-27, 41 y.o.   MRN: [id number]  Patient presents to clinic today to establish care. She has not sought care on a regular basis.  She has been seen at [facility] approximately 5 years ago and she has not seen a primary care provider in at least 3 years per patient. She has however been evaluated for women's health at her OBGYN  Last episode for care was on 11/06/16 at the ED for back pain. Back pain that was evaluated on 11/06/16 that was triggered with lifting 30 pound boxes at work. She reported low back pain with radiating pain down her left buttock and thigh.  She also reported that following time off work she developed radiating pain down her right buttock and knee.  Muscle strain was noted with acute right sided low back pain with sciatica and she was provided naprosyn and flexeril for symptoms that provided excellent benefit. She denies back pain today.  Obtaining patient's history is challenging as she is not forthcoming with information and she does not remain focused on questions asked during the exam. She denies any history of attention issues.   Acute concern:  She reports a concern for possible low blood sugar that she experienced at work. This occurred approximately one month ago per patient with a recent change in her diet. She reports removing carbohydrates, gluten, soy, and dairy prior to this episode. Symptoms of feeling irritable were noted. She denied feeling sweaty, confused, jittery, increased heart rate, syncope or presyncope.  No associated chest pain, palpitations, SOB, numbness, tingling, weakness, or headaches.  No history of diabetes. Treated with drinking soda which provided excellent benefit. After this episode, she started checking her blood sugar and have noted a range of lowest 69 to 140. She did not verify if these readings were fasting or nonfasting. She also did not verify what type of meter she was using.   During  this visit, she reports that she did not seek care as she did not have a provider that she "liked".  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 She was seen by Dr. Brett Fairy for this issue but states that she is no longer being treated as treatment is no longer needed. She does not provide any further history related to narcolepsy but does confirm evaluation with sleep studies and neurology.    Health Maintenance: Dental -- She has not been in 2 to 3 years.  Vision -- Yearly; wears glasses Immunizations -- Last Td in 2004; She is due for Tdap PAP -- She believes it was 2015 and she reports that there was no abnormal findings.   Past Surgical History:  Procedure Laterality Date  . CESAREAN SECTION      Current Outpatient Prescriptions on File Prior to Visit  Medication Sig Dispense Refill  . cyclobenzaprine (FLEXERIL) 5 MG tablet Take 1 tablet (5 mg total) by mouth at bedtime. 10 tablet 0  . ergocalciferol (VITAMIN D2) 50000 units capsule Take 5,000 Units by mouth daily.     . naproxen (NAPROSYN) 500 MG tablet Take 1 tablet (500 mg total) by mouth 2 (two) times daily with a meal. 20 tablet 0  . omeprazole (PRILOSEC) 20 MG capsule Take 1 capsule (20 mg total) by mouth daily. 14 capsule 0  . Prenatal Vit-Fe Fumarate-FA (PRENATAL MULTIVITAMIN) TABS tablet Take 1 tablet by mouth daily at 12 noon.     No current facility-administered medications on file prior to visit.  No Known Allergies  No family history on file.  Social History   Social History  . Marital status: Married    Spouse name: N/A  . Number of children: N/A  . Years of education: N/A   Occupational History  . Not on file.   Social History Main Topics  . Smoking status: Never Smoker  . Smokeless tobacco: Never Used  . Alcohol use No  . Drug use: No  . Sexual activity: Not on file   Other Topics Concern  . Not on file   Social History Narrative  . No narrative on file     Review of Systems  Constitutional: Negative for chills, fever and malaise/fatigue.  Eyes: Negative for blurred vision and double vision.  Respiratory: Negative for cough, hemoptysis, sputum production, shortness of breath and wheezing.   Cardiovascular: Negative for chest pain, orthopnea and leg swelling.  Gastrointestinal: Negative for abdominal pain, diarrhea, heartburn, nausea and vomiting.  Genitourinary: Negative for dysuria, frequency and urgency.  Musculoskeletal: Negative for myalgias.  Skin: Negative for rash.  Neurological: Negative for dizziness and headaches.  Psychiatric/Behavioral:       Denies depressed or anxious mood    Temp 98.4 F (36.9 C) (Oral)   Wt 132 lb 3.2 oz (60 kg)   BMI 25.39 kg/m   Physical Exam  Constitutional: She is oriented to person, place, and time and well-developed, well-nourished, and in no distress.  HENT:  Right Ear: Tympanic membrane normal.  Left Ear: Tympanic membrane normal.  Nose: No rhinorrhea. Right sinus exhibits no maxillary sinus tenderness and no frontal sinus tenderness. Left sinus exhibits no maxillary sinus tenderness and no frontal sinus tenderness.  Mouth/Throat: Oropharynx is clear and moist.  Eyes: Pupils are equal, round, and reactive to light. No scleral icterus.  Neck: Neck supple.  Cardiovascular: Normal rate, regular rhythm and intact distal pulses.   Pulmonary/Chest: Effort normal and breath sounds normal. She has no rales.  Abdominal: Soft. Bowel sounds are normal. There is no tenderness.  Musculoskeletal: She exhibits no edema.  Lymphadenopathy:    She has no cervical adenopathy.  Neurological: She is alert and oriented to person, place, and time. Gait normal.  Skin: Skin is warm and dry. No rash noted.  Psychiatric:  No obvious depression or anxiety noted     Assessment/Plan: 1. Hypoglycemia Symptoms reported concerning for hypoglycemia; will check BMP today; advised patient to eat frequent meals and  keep source of sugar available for symptoms of hypoglycemia. Reviewed signs/symptoms of hypoglycemia and also recommended that she avoid any dietary changes that may be a source of not getting enough nutrients and food.    - Basic metabolic panel  2. Encounter to establish care We reviewed the PMH, PSH, FH, SH, Meds and Allergies. -We provided refills for any medications we will prescribe as needed. -We addressed current concerns per orders and patient instructions. -We have asked for records for pertinent exams, studies, vaccines and notes from previous providers. -We have advised patient to follow up per instructions below.   -Patient advised to return or notify a provider immediately if symptoms worsen or persist or new concerns arise.  Patient did not complete paperwork relating to history prior to or during this appointment. She was asked to complete this information, submit written data, and follow up for a physical and lab work. She is due for preventive care and will consider immunizations that are recommended at that time. She has requested information for an OBGYN for women's health  concerns. [facility] information provided.   Delano Metz, FNP-C  .

## 2017-02-26 NOTE — Patient Instructions (Addendum)
We have ordered labs or studies at this visit. It can take up to 1-2 weeks for results and processing. IF results require follow up or explanation, we will call you with instructions. Clinically stable results will be released to your [facility] If you have not heard from [place] or cannot find your results in [facility] in 2 weeks please contact our office at [phone].  If you are not yet signed up for [facility], please consider signing up   Dr. Princess Bruins is with [facility] and number is [phone].  Also, please focus on frequent meals, and monitoring for symptoms of low blood sugar as we discussed. Also keep glucose tablets with you. See information below.   Hypoglycemia Hypoglycemia is when the sugar (glucose) level in the blood is too low. Symptoms of low blood sugar may include:  Feeling: ? Hungry. ? Worried or nervous (anxious). ? Sweaty and clammy. ? Confused. ? Dizzy. ? Sleepy. ? Sick to your stomach (nauseous).  Having: ? A fast heartbeat. ? A headache. ? A change in your vision. ? Jerky movements that you cannot control (seizure). ? Nightmares. ? Tingling or no feeling (numbness) around the mouth, lips, or tongue.  Having trouble with: ? Talking. ? Paying attention (concentrating). ? Moving (coordination). ? Sleeping.  Shaking.  Passing out (fainting).  Getting upset easily (irritability).  Low blood sugar can happen to people who have diabetes and people who do not have diabetes. Low blood sugar can happen quickly, and it can be an emergency. Treating Low Blood Sugar Low blood sugar is often treated by eating or drinking something sugary right away. If you can think clearly and swallow safely, follow the 15:15 rule:  Take 15 grams of a fast-acting carb (carbohydrate). Some fast-acting carbs are: ? 1 tube of glucose gel. ? 3 sugar tablets (glucose pills). ? 6-8 pieces of hard candy. ? 4 oz (120 mL) of fruit juice. ? 4 oz (120 mL) of regular  (not diet) soda.  Check your blood sugar 15 minutes after you take the carb.  If your blood sugar is still at or below 70 mg/dL (3.9 mmol/L), take 15 grams of a carb again.  If your blood sugar does not go above 70 mg/dL (3.9 mmol/L) after 3 tries, get help right away.  After your blood sugar goes back to normal, eat a meal or a snack within 1 hour.  Treating Very Low Blood Sugar If your blood sugar is at or below 54 mg/dL (3 mmol/L), you have very low blood sugar (severe hypoglycemia). This is an emergency. Do not wait to see if the symptoms will go away. Get medical help right away. Call your local emergency services (911 in the [place] Do not drive yourself to the hospital. If you have very low blood sugar and you cannot eat or drink, you may need a glucagon shot (injection). A family member or friend should learn how to check your blood sugar and how to give you a glucagon shot. Ask your doctor if you need to have a glucagon shot kit at home. Follow these instructions at home: General instructions  Avoid any diets that cause you to not eat enough food. Talk with your doctor before you start any new diet.  Take over-the-counter and prescription medicines only as told by your doctor.  Limit alcohol to no more than 1 drink per day for nonpregnant women and 2 drinks per day for men. One drink equals 12 oz of beer, 5 oz of  wine, or 1 oz of hard liquor.  Keep all follow-up visits as told by your doctor. This is important. If You Have Diabetes:   Make sure you know the symptoms of low blood sugar.  Always keep a source of sugar with you, such as: ? Sugar. ? Sugar tablets. ? Glucose gel. ? Fruit juice. ? Regular soda (not diet soda). ? Milk. ? Hard candy. ? Honey.  Take your medicines as told.  Follow your exercise and meal plan. ? Eat on time. Do not skip meals. ? Follow your sick day plan when you cannot eat or drink normally. Make this plan ahead of time with your  doctor.  Check your blood sugar as often as told by your doctor. Always check before and after exercise.  Share your diabetes care plan with: ? Your work or school. ? People you live with.  Check your pee (urine) for ketones: ? When you are sick. ? As told by your doctor.  Carry a card or wear jewelry that says you have diabetes. If You Have Low Blood Sugar From Other Causes:   Check your blood sugar as often as told by your doctor.  Follow instructions from your doctor about what you cannot eat or drink. Contact a doctor if:  You have trouble keeping your blood sugar in your target range.  You have low blood sugar often. Get help right away if:  You still have symptoms after you eat or drink something sugary.  Your blood sugar is at or below 54 mg/dL (3 mmol/L).  You have jerky movements that you cannot control.  You pass out. These symptoms may be an emergency. Do not wait to see if the symptoms will go away. Get medical help right away. Call your local emergency services (911 in the [place] Do not drive yourself to the hospital. This information is not intended to replace advice given to you by your health care provider. Make sure you discuss any questions you have with your health care provider. Document Released: 11/29/2009 Document Revised: 02/10/2016 Document Reviewed: 10/08/2015 Elsevier Interactive Patient Education  2018 [place] NOW OFFER   [facility] FAST TRACK!!!  SAME DAY Appointments for ACUTE CARE  Such as: Sprains, Injuries, cuts, abrasions, rashes, muscle pain, joint pain, back pain Colds, flu, sore throats, headache, allergies, cough, fever  Ear pain, sinus and eye infections Abdominal pain, nausea, vomiting, diarrhea, upset stomach Animal/insect bites  3 Easy Ways to Schedule: Walk-In Scheduling Call in scheduling Mychart Sign-up: [email or web address]

## 2017-02-28 ENCOUNTER — Telehealth: Payer: Self-pay | Admitting: Family Medicine

## 2017-02-28 NOTE — Telephone Encounter (Signed)
The patient dropped off ADA forms and we rescheduled her CPE appointment for 06/25 to 06/14 and I gave the form to [place] to have for the patients about tomorrow.

## 2017-03-01 ENCOUNTER — Other Ambulatory Visit (HOSPITAL_COMMUNITY)
Admission: RE | Admit: 2017-03-01 | Discharge: 2017-03-01 | Disposition: A | Discharge status: Home / Self Care | Payer: BLUE CROSS/BLUE SHIELD | Source: Ambulatory Visit | Attending: Family Medicine | Admitting: Family Medicine

## 2017-03-01 ENCOUNTER — Ambulatory Visit (INDEPENDENT_AMBULATORY_CARE_PROVIDER_SITE_OTHER): Payer: BLUE CROSS/BLUE SHIELD | Admitting: Family Medicine

## 2017-03-01 ENCOUNTER — Encounter: Payer: Self-pay | Admitting: Family Medicine

## 2017-03-01 VITALS — BP 110/82 | HR 96 | Temp 98.6°F | Wt 127.8 lb

## 2017-03-01 DIAGNOSIS — E162 Hypoglycemia, unspecified: Secondary | ICD-10-CM | POA: Insufficient documentation

## 2017-03-01 DIAGNOSIS — Z87898 Personal history of other specified conditions: Secondary | ICD-10-CM

## 2017-03-01 DIAGNOSIS — Z0000 Encounter for general adult medical examination without abnormal findings: Secondary | ICD-10-CM | POA: Diagnosis not present

## 2017-03-01 DIAGNOSIS — G47419 Narcolepsy without cataplexy: Secondary | ICD-10-CM | POA: Diagnosis not present

## 2017-03-01 DIAGNOSIS — Z124 Encounter for screening for malignant neoplasm of cervix: Secondary | ICD-10-CM | POA: Diagnosis not present

## 2017-03-01 DIAGNOSIS — Z23 Encounter for immunization: Secondary | ICD-10-CM

## 2017-03-01 LAB — CBC WITH DIFFERENTIAL/PLATELET
BASOS ABS: 0 10*3/uL (ref 0.0–0.1)
Basophils Relative: 0.2 % (ref 0.0–3.0)
Eosinophils Absolute: 0 10*3/uL (ref 0.0–0.7)
Eosinophils Relative: 0.4 % (ref 0.0–5.0)
HCT: 42.7 % (ref 36.0–46.0)
Hemoglobin: 13.7 g/dL (ref 12.0–15.0)
LYMPHS ABS: 2.5 10*3/uL (ref 0.7–4.0)
Lymphocytes Relative: 33.7 % (ref 12.0–46.0)
MCHC: 32 g/dL (ref 30.0–36.0)
MCV: 82.9 fl (ref 78.0–100.0)
MONO ABS: 0.4 10*3/uL (ref 0.1–1.0)
MONOS PCT: 5.2 % (ref 3.0–12.0)
NEUTROS ABS: 4.4 10*3/uL (ref 1.4–7.7)
Neutrophils Relative %: 60.5 % (ref 43.0–77.0)
PLATELETS: 313 10*3/uL (ref 150.0–400.0)
RBC: 5.15 Mil/uL — ABNORMAL HIGH (ref 3.87–5.11)
RDW: 13.3 % (ref 11.5–15.5)
WBC: 7.3 10*3/uL (ref 4.0–10.5)

## 2017-03-01 LAB — LIPID PANEL
CHOLESTEROL: 228 mg/dL — AB (ref 0–200)
HDL: 65.4 mg/dL (ref >39.00)
LDL CALC: 151 mg/dL — AB (ref 0–99)
NonHDL: 162.87
Total CHOL/HDL Ratio: 3
Triglycerides: 60 mg/dL (ref 0.0–149.0)
VLDL: 12 mg/dL (ref 0.0–40.0)

## 2017-03-01 LAB — HEPATIC FUNCTION PANEL
ALT: 11 U/L (ref 0–35)
AST: 16 U/L (ref 0–37)
Albumin: 4.5 g/dL (ref 3.5–5.2)
Alkaline Phosphatase: 57 U/L (ref 39–117)
BILIRUBIN TOTAL: 0.7 mg/dL (ref 0.2–1.2)
Bilirubin, Direct: 0.1 mg/dL (ref 0.0–0.3)
Total Protein: 7 g/dL (ref 6.0–8.3)

## 2017-03-01 LAB — TSH: TSH: 1.06 u[IU]/mL (ref 0.35–4.50)

## 2017-03-01 NOTE — Patient Instructions (Addendum)
It was pleasure to see you today.  Please see information below for preventive health maintenance.    You will be contact about your referral.  Please let us know if you have not heard back within 1 week about your referral.  Health Maintenance, Female Adopting a healthy lifestyle and getting preventive care can go a long way to promote health and wellness. Talk with your health care provider about what schedule of regular examinations is right for you. This is a good chance for you to check in with your provider about disease prevention and staying healthy. In between checkups, there are plenty of things you can do on your own. Experts have done a lot of research about which lifestyle changes and preventive measures are most likely to keep you healthy. Ask your health care provider for more information. Weight and diet Eat a healthy diet  Be sure to include plenty of vegetables, fruits, low-fat dairy products, and lean protein.  Do not eat a lot of foods high in solid fats, added sugars, or salt.  Get regular exercise. This is one of the most important things you can do for your health. ? Most adults should exercise for at least 150 minutes each week. The exercise should increase your heart rate and make you sweat (moderate-intensity exercise). ? Most adults should also do strengthening exercises at least twice a week. This is in addition to the moderate-intensity exercise.  Maintain a healthy weight  Body mass index (BMI) is a measurement that can be used to identify possible weight problems. It estimates body fat based on height and weight. Your health care provider can help determine your BMI and help you achieve or maintain a healthy weight.  For females 73 years of age and older: ? A BMI below 18.5 is considered underweight. ? A BMI of 18.5 to 24.9 is normal. ? A BMI of 25 to 29.9 is considered overweight. ? A BMI of 30 and above is considered obese.  Watch levels of cholesterol and  blood lipids  You should start having your blood tested for lipids and cholesterol at 39 years of age, then have this test every 5 years.  You may need to have your cholesterol levels checked more often if: ? Your lipid or cholesterol levels are high. ? You are older than 39 years of age. ? You are at high risk for heart disease.  Cancer screening Lung Cancer  Lung cancer screening is recommended for adults 9-58 years old who are at high risk for lung cancer because of a history of smoking.  A yearly low-dose CT scan of the lungs is recommended for people who: ? Currently smoke. ? Have quit within the past 15 years. ? Have at least a 30-pack-year history of smoking. A pack year is smoking an average of one pack of cigarettes a day for 1 year.  Yearly screening should continue until it has been 15 years since you quit.  Yearly screening should stop if you develop a health problem that would prevent you from having lung cancer treatment.  Breast Cancer  Practice breast self-awareness. This means understanding how your breasts normally appear and feel.  It also means doing regular breast self-exams. Let your health care provider know about any changes, no matter how small.  If you are in your 20s or 30s, you should have a clinical breast exam (CBE) by a health care provider every 1-3 years as part of a regular health exam.  If you  are 87 or older, have a CBE every year. Also consider having a breast X-ray (mammogram) every year.  If you have a family history of breast cancer, talk to your health care provider about genetic screening.  If you are at high risk for breast cancer, talk to your health care provider about having an MRI and a mammogram every year.  Breast cancer gene (BRCA) assessment is recommended for women who have family members with BRCA-related cancers. BRCA-related cancers include: ? Breast. ? Ovarian. ? Tubal. ? Peritoneal cancers.  Results of the assessment  will determine the need for genetic counseling and BRCA1 and BRCA2 testing.  Cervical Cancer Your health care provider may recommend that you be screened regularly for cancer of the pelvic organs (ovaries, uterus, and vagina). This screening involves a pelvic examination, including checking for microscopic changes to the surface of your cervix (Pap test). You may be encouraged to have this screening done every 3 years, beginning at age 65.  For women ages 72-65, health care providers may recommend pelvic exams and Pap testing every 3 years, or they may recommend the Pap and pelvic exam, combined with testing for human papilloma virus (HPV), every 5 years. Some types of HPV increase your risk of cervical cancer. Testing for HPV may also be done on women of any age with unclear Pap test results.  Other health care providers may not recommend any screening for nonpregnant women who are considered low risk for pelvic cancer and who do not have symptoms. Ask your health care provider if a screening pelvic exam is right for you.  If you have had past treatment for cervical cancer or a condition that could lead to cancer, you need Pap tests and screening for cancer for at least 20 years after your treatment. If Pap tests have been discontinued, your risk factors (such as having a new sexual partner) need to be reassessed to determine if screening should resume. Some women have medical problems that increase the chance of getting cervical cancer. In these cases, your health care provider may recommend more frequent screening and Pap tests.  Colorectal Cancer  This type of cancer can be detected and often prevented.  Routine colorectal cancer screening usually begins at 39 years of age and continues through 39 years of age.  Your health care provider may recommend screening at an earlier age if you have risk factors for colon cancer.  Your health care provider may also recommend using home test kits to  check for hidden blood in the stool.  A small camera at the end of a tube can be used to examine your colon directly (sigmoidoscopy or colonoscopy). This is done to check for the earliest forms of colorectal cancer.  Routine screening usually begins at age 43.  Direct examination of the colon should be repeated every 5-10 years through 39 years of age. However, you may need to be screened more often if early forms of precancerous polyps or small growths are found.  Skin Cancer  Check your skin from head to toe regularly.  Tell your health care provider about any new moles or changes in moles, especially if there is a change in a mole's shape or color.  Also tell your health care provider if you have a mole that is larger than the size of a pencil eraser.  Always use sunscreen. Apply sunscreen liberally and repeatedly throughout the day.  Protect yourself by wearing long sleeves, pants, a wide-brimmed hat, and sunglasses whenever  you are outside.  Heart disease, diabetes, and high blood pressure  High blood pressure causes heart disease and increases the risk of stroke. High blood pressure is more likely to develop in: ? People who have blood pressure in the high end of the normal range (130-139/85-89 mm Hg). ? People who are overweight or obese. ? People who are African American.  If you are 36-59 years of age, have your blood pressure checked every 3-5 years. If you are 25 years of age or older, have your blood pressure checked every year. You should have your blood pressure measured twice-once when you are at a hospital or clinic, and once when you are not at a hospital or clinic. Record the average of the two measurements. To check your blood pressure when you are not at a hospital or clinic, you can use: ? An automated blood pressure machine at a pharmacy. ? A home blood pressure monitor.  If you are between 31 years and 7 years old, ask your health care provider if you should  take aspirin to prevent strokes.  Have regular diabetes screenings. This involves taking a blood sample to check your fasting blood sugar level. ? If you are at a normal weight and have a low risk for diabetes, have this test once every three years after 39 years of age. ? If you are overweight and have a high risk for diabetes, consider being tested at a younger age or more often. Preventing infection Hepatitis B  If you have a higher risk for hepatitis B, you should be screened for this virus. You are considered at high risk for hepatitis B if: ? You were born in a country where hepatitis B is common. Ask your health care provider which countries are considered high risk. ? Your parents were born in a high-risk country, and you have not been immunized against hepatitis B (hepatitis B vaccine). ? You have HIV or AIDS. ? You use needles to inject street drugs. ? You live with someone who has hepatitis B. ? You have had sex with someone who has hepatitis B. ? You get hemodialysis treatment. ? You take certain medicines for conditions, including cancer, organ transplantation, and autoimmune conditions.  Hepatitis C  Blood testing is recommended for: ? Everyone born from [birth year] through 1963. ? Anyone with known risk factors for hepatitis C.  Sexually transmitted infections (STIs)  You should be screened for sexually transmitted infections (STIs) including gonorrhea and chlamydia if: ? You are sexually active and are younger than 39 years of age. ? You are older than 39 years of age and your health care provider tells you that you are at risk for this type of infection. ? Your sexual activity has changed since you were last screened and you are at an increased risk for chlamydia or gonorrhea. Ask your health care provider if you are at risk.  If you do not have HIV, but are at risk, it may be recommended that you take a prescription medicine daily to prevent HIV infection. This is called  pre-exposure prophylaxis (PrEP). You are considered at risk if: ? You are sexually active and do not regularly use condoms or know the HIV status of your partner(s). ? You take drugs by injection. ? You are sexually active with a partner who has HIV.  Talk with your health care provider about whether you are at high risk of being infected with HIV. If you choose to begin PrEP, you should first  be tested for HIV. You should then be tested every 3 months for as long as you are taking PrEP. Pregnancy  If you are premenopausal and you may become pregnant, ask your health care provider about preconception counseling.  If you may become pregnant, take 400 to 800 micrograms (mcg) of folic acid every day.  If you want to prevent pregnancy, talk to your health care provider about birth control (contraception). Osteoporosis and menopause  Osteoporosis is a disease in which the bones lose minerals and strength with aging. This can result in serious bone fractures. Your risk for osteoporosis can be identified using a bone density scan.  If you are 77 years of age or older, or if you are at risk for osteoporosis and fractures, ask your health care provider if you should be screened.  Ask your health care provider whether you should take a calcium or vitamin D supplement to lower your risk for osteoporosis.  Menopause may have certain physical symptoms and risks.  Hormone replacement therapy may reduce some of these symptoms and risks. Talk to your health care provider about whether hormone replacement therapy is right for you. Follow these instructions at home:  Schedule regular health, dental, and eye exams.  Stay current with your immunizations.  Do not use any tobacco products including cigarettes, chewing tobacco, or electronic cigarettes.  If you are pregnant, do not drink alcohol.  If you are breastfeeding, limit how much and how often you drink alcohol.  Limit alcohol intake to no more  than 1 drink per day for nonpregnant women. One drink equals 12 ounces of beer, 5 ounces of wine, or 1 ounces of hard liquor.  Do not use street drugs.  Do not share needles.  Ask your health care provider for help if you need support or information about quitting drugs.  Tell your health care provider if you often feel depressed.  Tell your health care provider if you have ever been abused or do not feel safe at home. This information is not intended to replace advice given to you by your health care provider. Make sure you discuss any questions you have with your health care provider. Document Released: 03/20/2011 Document Revised: 02/10/2016 Document Reviewed: 06/08/2015 Elsevier Interactive Patient Education  [employer].

## 2017-03-01 NOTE — Progress Notes (Addendum)
Subjective:    Patient ID: Jane Eaton, female    DOB: 05-Aug-1976, 41 y.o.   MRN: [id number]  HPI  Jane Eaton is a 41 year old female who presents today for routine physical exam.  She established care 3 days ago and outside medical records have not been obtained at this time.  She reports that she feels well however she is requesting that paperwork for her job be completed for accommodations allowing her to have frequent snacks to avoid possible episodes of low blood sugar. She reports a history of low blood sugars (subjective) and she would like to have snack breaks every 3 hours to avoid potential episodes of hypoglycemia. She denies any episode of hypoglycemia since last visit and reports that she has focused on improving her dietary habits which may have contributed to these symptoms previously.  She reports being seen by a women's health provider ([facility]);   History of seizures that was reported as a child. She denies any recent seizure activity but states that these occurred "at sleep" so this may be unknown. 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 Today, she denies any recent seizure activity. She denies syncope, lightheadedness, numbness, tingling, or weakness. She reports a history of having a sleep study completed with a diagnosis of narcolepsy with previous treatment of Nuvigil and persistent disorder init/maintaining wakefulness.  Obtaining history is challenging as records are not available at this time.  She does not exercise however work activities include lifting boxes that are 35 lbs a piece and she denies cardiopulmonary symptoms. Follows a modified carbohydrate diet and is focused on eating regulary meals  She is due for dental care and plans to schedule this soon and Tdap today.  Review of Systems  Constitutional: No fever, chills, significant weight  change, fatigue, weakness or night sweats Eyes: No redness, discharge, pain, blurred vision, double vision, or loss of vision ENT/mouth: No nasal congestion, postnasal drainage,epistaxis, purulent discharge, earache, hearing loss, tinnitus ,sore throat , dental pain, or hoarseness   Cardiovascular: no chest pain, palpitations, racing, irregular rhythm, syncope, nausea, sweating, claudication, or edema  Respiratory: No cough, sputum production,hemoptysis,  dyspnea, paroxysmal nocturnal dyspnea, pleuritic chest pain, significant snoring, or  apnea    Gastrointestinal: No heartburn,dysphagia, nausea and vomiting,ominal pain, change in bowels, anorexia, diarrhea, significant constipation, rectal bleeding, melena,  stool incontinence or jaundice Genitourinary: No dysuria,hematuria, pyuria, frequency, urgency,  incontinence, nocturia, dark urine or flank pain Musculoskeletal: No myalgias or muscle cramping, joint stiffness, joint swelling, joint color change, weakness, or cyanosis Dermatologic: No rash, pruritus, urticaria, or change in color or temperature of skin Neurologic: No headache, vertigo, limb weakness, tremor, gait disturbance, seizures, memory loss, numbness or tingling Psychiatric: No significant anxiety or depression, anhedonia, panic attacks, insomnia, or anorexia Endocrine: No change in hair/skin/ nails, excessive thirst, excessive hunger, excessive urination, or unexplained fatigue Hematologic/lymphatic: No bruising, lymphadenopathy,or  abnormal clotting Allergy/immunology: No itchy/ watery eyes, abnormal sneezing, rhinitis, urticaria ,or angioedema  Past Medical History:  Diagnosis Date  . Allergy   . Anemia   . Depression   . Seizures ([place])   . UTI (urinary tract infection)      Social History   Social History  . Marital status: Married    Spouse name: N/A  . Number of children: N/A  . Years of education: N/A   Occupational History  . Not on  file.   Social History Main  Topics  . Smoking status: Never Smoker  . Smokeless tobacco: Never Used  . Alcohol use No  . Drug use: No  . Sexual activity: Yes    Partners: Male   Other Topics Concern  . Not on file   Social History Narrative  . No narrative on file    Past Surgical History:  Procedure Laterality Date  . CESAREAN SECTION      Family History  Problem Relation Age of Onset  . Hypertension Mother   . Diabetes Mother   . Alcohol abuse Father   . Heart disease Father   . Hyperlipidemia Father     No Known Allergies  Current Outpatient Prescriptions on File Prior to Visit  Medication Sig Dispense Refill  . cyclobenzaprine (FLEXERIL) 5 MG tablet Take 1 tablet (5 mg total) by mouth at bedtime. 10 tablet 0  . ergocalciferol (VITAMIN D2) 50000 units capsule Take 5,000 Units by mouth daily.     . Evening Primrose Oil 1000 MG CAPS Take 1,000 mg by mouth daily.    . naproxen (NAPROSYN) 500 MG tablet Take 1 tablet (500 mg total) by mouth 2 (two) times daily with a meal. 20 tablet 0  . omeprazole (PRILOSEC) 20 MG capsule Take 1 capsule (20 mg total) by mouth daily. 14 capsule 0  . Prenatal Vit-Fe Fumarate-FA (PRENATAL MULTIVITAMIN) TABS tablet Take 1 tablet by mouth daily at 12 noon.    . vitamin E 400 UNIT capsule Take 400 Units by mouth daily.     No current facility-administered medications on file prior to visit.     BP 110/82 (BP Location: Left Arm, Patient Position: Sitting, Cuff Size: Normal)   Pulse 96   Temp 98.6 F (37 C) (Oral)   Wt 127 lb 12.8 oz (58 kg)   LMP 01/30/2017 (Exact Date)   SpO2 97%   BMI 24.15 kg/m       Objective:   Physical Exam Physical Exam  Constitutional: She is oriented to person, place, and time. She appears well-developed and well-nourished. No distress.  HENT:  Head: Normocephalic and atraumatic.  Right Ear: Tympanic membrane and ear canal normal.  Left Ear: Tympanic membrane and ear canal normal.  Mouth/Throat: Oropharynx is clear and moist.    Eyes: Pupils are equal, round, and reactive to light. No scleral icterus.  Neck: Normal range of motion. No thyromegaly present.  Cardiovascular: Normal rate and regular rhythm.   No murmur heard. Pulmonary/Chest: Effort normal and breath sounds normal. No respiratory distress. She has no wheezes. She has no rales. She exhibits no tenderness.  Abdominal: Soft. Bowel sounds are normal. She exhibits no distension and no mass. There is no tenderness. There is no rebound and no guarding.  Musculoskeletal: She exhibits no edema.  Lymphadenopathy:    She has no cervical adenopathy.  Neurological: She is alert and oriented to person, place, and time. She has normal patellar reflexes. She exhibits normal muscle tone. Coordination normal.  Skin: Skin is warm and dry.  Psychiatric: She has a normal mood and affect. Her behavior is normal. Judgment and thought content normal.  Breasts: Examined lying Right: Without masses, retractions, discharge or axillary adenopathy.  Left: Without masses, retractions, discharge or axillary adenopathy.  Inguinal/mons: Normal without inguinal adenopathy  External genitalia: Normal  BUS/Urethra/Skene's glands: Normal  Bladder: Normal  Vagina: Normal  Cervix: Normal  Uterus: normal in size, shape and contour. Midline and mobile  Adnexa/parametria:  Rt: Without  masses or tenderness.  Lt: Without masses or tenderness.  Anus and perineum: Normal     Assessment & Plan:  1. Routine general medical examination at a health care facility 41 y.o. female presenting for annual physical.  Health Maintenance counseling: 1. Anticipatory guidance: Patient counseled regarding regular dental exams, eye exams, wearing seatbelts.  2. Risk factor reduction:  Advised patient of need for regular exercise and diet rich and fruits and vegetables to reduce risk of heart attack and stroke.  3. Immunizations/screenings/ancillary studies: Tdap today Immunization History  Administered  Date(s) Administered  . Td 09/22/2002   Health Maintenance Due  Topic Date Due  . HIV Screening  04/01/1993  . TETANUS/TDAP  09/22/2012  . PAP SMEAR  04/05/2013   4. Cervical cancer screening- Completed today; pap sent to lab with HPV cotesting 5. Breast cancer screening-  breast exam today: No abnormal findings; reviewed importance of monthly BSE 6. Skin cancer screening: No suspicious lesions noted; reviewed ABCDEs and wearing sunscreen  2. Hypoglycemia Controlled; BMP results normal 02/26/17;no symptoms reported since last visit; advised frequent small meals and advised her to eat frequently. Further advised her to keep glucose tablets available and follow up for further evaluation if symptoms are noticed again. 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 Further advised evaluation with neurology as she also has a history of seizures as a child and subjective symptoms provided by patient for hypoglycemia do not correlate with lab work and history is challenging to obtain as patient is not clear regarding past treatments. - Ambulatory referral to Neurology  4. History of seizures Unclear history and combination of narcolepsy with patient requesting accommodations at work due to chronic conditions. Advised her that a neurology evaluation/consult will be needed as she has not completed follow up recommendations from prior providers.  - Ambulatory referral to Neurology  Delano Metz, FNP-C

## 2017-03-01 NOTE — Telephone Encounter (Signed)
Please contact patient and let her know that I cannot complete paperwork for accomodation for work. No physical or mental impairment that qualifies for accomodation noted. If she develops symptoms of low blood sugar; advise keeping glucose tablets on hand. Also advise her to eat regular meals daily.

## 2017-03-01 NOTE — Telephone Encounter (Signed)
ADA form received and is awaiting Gregary Signs to complete her part after patient physical today.

## 2017-03-02 ENCOUNTER — Telehealth: Payer: Self-pay | Admitting: Family Medicine

## 2017-03-02 ENCOUNTER — Encounter: Payer: Self-pay | Admitting: Family Medicine

## 2017-03-02 LAB — CYTOLOGY - PAP
DIAGNOSIS: NEGATIVE
HPV: NOT DETECTED

## 2017-03-02 NOTE — Telephone Encounter (Signed)
Noted  

## 2017-03-02 NOTE — Telephone Encounter (Signed)
Additional information relating to request for accommodations at work. As her symptoms of hypoglycemia that she reported do not correlate with lab work, she will need to be evaluated by neurology due to prior history to rule out neurological cause of symptoms.

## 2017-03-02 NOTE — Telephone Encounter (Signed)
Spoke with patient voiced understanding that Gregary Signs cannot complete the paperwork due to no physical or mental impairment that qualifies for accomodation noted.Patient was advised to keep glucose tablets on hand if she develops symptoms of low blood sugar and to follow up with a neurology for further evaluation.

## 2017-03-05 ENCOUNTER — Ambulatory Visit (INDEPENDENT_AMBULATORY_CARE_PROVIDER_SITE_OTHER): Payer: BLUE CROSS/BLUE SHIELD | Admitting: Family Medicine

## 2017-03-05 ENCOUNTER — Encounter: Payer: Self-pay | Admitting: Family Medicine

## 2017-03-05 VITALS — BP 100/82 | HR 86 | Temp 99.1°F | Ht 61.0 in | Wt 129.2 lb

## 2017-03-05 DIAGNOSIS — D171 Benign lipomatous neoplasm of skin and subcutaneous tissue of trunk: Secondary | ICD-10-CM

## 2017-03-05 DIAGNOSIS — D1722 Benign lipomatous neoplasm of skin and subcutaneous tissue of left arm: Secondary | ICD-10-CM

## 2017-03-05 NOTE — Progress Notes (Signed)
Subjective:    Patient ID: Jane Eaton, female    DOB: [birth year], 41 y.o.   MRN: [id number]  HPI  Jane Eaton is a 41 year old female who presents today with a knot on her left arm that has been present for 3 years. Associated "lump" on her back is also present. She denies pain, fever, chills, sweats, unexpected weight loss, or family history of this type of concern. No other systemic symptoms are noted. History of narcolepsy and remote history of seizures is noted. Patient does not provide a clear history of seizures and combination of narcolepsy with patient requesting accommodations due to chronic conditions has initiated a referral to neurology last week. She is awaiting her appointment at this time. No treatments have been tried at home. No aggravating or alleviating factors noted No trigger of symptom identified.  Soft, painless, SQ nodule that ranges from 1 to >10 cm; usually occur on trunk and upper extremities, round, oval, or multilobulated   Review of Systems  Constitutional: Negative for chills, fatigue and fever.  Respiratory: Negative for cough, shortness of breath and wheezing.   Cardiovascular: Negative for chest pain and palpitations.  Gastrointestinal: Negative for abdominal pain, diarrhea, nausea and vomiting.  Skin: Negative for rash.       Lump on left arm and back  Neurological: Negative for dizziness, weakness, light-headedness, numbness and headaches.   Past Medical History:  Diagnosis Date  . Allergy   . Anemia   . Depression   . History of seizures   . UTI (urinary tract infection)      Social History   Social History  . Marital status: Married    Spouse name: N/A  . Number of children: N/A  . Years of education: N/A   Occupational History  . Not on file.   Social History Main Topics  . Smoking status: Never Smoker  . Smokeless tobacco: Never Used  . Alcohol use No  . Drug use: No  . Sexual activity: Yes    Partners: Male   Other  Topics Concern  . Not on file   Social History Narrative  . No narrative on file    Past Surgical History:  Procedure Laterality Date  . CESAREAN SECTION      Family History  Problem Relation Age of Onset  . Hypertension Mother   . Diabetes Mother   . Alcohol abuse Father   . Heart disease Father   . Hyperlipidemia Father     No Known Allergies  Current Outpatient Prescriptions on File Prior to Visit  Medication Sig Dispense Refill  . cyclobenzaprine (FLEXERIL) 5 MG tablet Take 1 tablet (5 mg total) by mouth at bedtime. 10 tablet 0  . ergocalciferol (VITAMIN D2) 50000 units capsule Take 5,000 Units by mouth daily.     . Evening Primrose Oil 1000 MG CAPS Take 1,000 mg by mouth daily.    . naproxen (NAPROSYN) 500 MG tablet Take 1 tablet (500 mg total) by mouth 2 (two) times daily with a meal. 20 tablet 0  . omeprazole (PRILOSEC) 20 MG capsule Take 1 capsule (20 mg total) by mouth daily. 14 capsule 0  . Prenatal Vit-Fe Fumarate-FA (PRENATAL MULTIVITAMIN) TABS tablet Take 1 tablet by mouth daily at 12 noon.    . vitamin E 400 UNIT capsule Take 400 Units by mouth daily.     No current facility-administered medications on file prior to visit.     BP 100/82 (BP Location: Left Arm, Patient  Position: Sitting, Cuff Size: Normal)   Pulse 86   Temp 99.1 F (37.3 C) (Oral)   Ht 5\' 1"  (1.549 m)   Wt 129 lb 3.2 oz (58.6 kg)   SpO2 99%   BMI 24.41 kg/m       Objective:   Physical Exam  Constitutional: She is oriented to person, place, and time. She appears well-developed and well-nourished.  Eyes: Pupils are equal, round, and reactive to light. No scleral icterus.  Neck: Neck supple.  Cardiovascular: Normal rate, regular rhythm and intact distal pulses.   Pulmonary/Chest: Effort normal and breath sounds normal. She has no wheezes. She has no rales.  Musculoskeletal: She exhibits no edema.  Soft, painless, movable nodule that transilluminates on left anterior upper extremity  and lower right side of back. Left anterior arm nodule is round and approximately 2 cm x 2cm Back nodule is round and approximately 1 cm x 1 cm.   Lymphadenopathy:    She has no cervical adenopathy.  Neurological: She is alert and oriented to person, place, and time.  Skin: Skin is warm and dry. No rash noted.       Assessment & Plan:  1. Lipoma of left upper extremity Lesion is soft; painless; movable nodule that transilluminates; advised her to monitor for any changes; she indicated a possible interest in having this removed; advised her that she can be referred to general surgery for this concern. She stated that she will contact me if she decides to have this removed. We also discussed further imaging if she is interested to confirm diagnosis; she declined imaging at this time.  2. Lipoma of back See information above that is consistent with nodule on back.   Patient will follow up and contact me if she would like to seek additional imaging or referral. Written information and return precautions provided. See AVS.  Delano Metz, FNP-C

## 2017-03-05 NOTE — Patient Instructions (Signed)
Please monitor sites for any changes and follow up if areas change, become red, swollen, painful, or hard.  Please follow up with neurology as recommended.  Lipoma A lipoma is a noncancerous (benign) tumor that is made up of fat cells. This is a very common type of soft-tissue growth. Lipomas are usually found under the skin (subcutaneous). They may occur in any tissue of the body that contains fat. Common areas for lipomas to appear include the back, shoulders, buttocks, and thighs. Lipomas grow slowly, and they are usually painless. Most lipomas do not cause problems and do not require treatment. What are the causes? The cause of this condition is not known. What increases the risk? This condition is more likely to develop in:  People who are 43-59 years old.  People who have a family history of lipomas.  What are the signs or symptoms? A lipoma usually appears as a small, round bump under the skin. It may feel soft or rubbery, but the firmness can vary. Most lipomas are not painful. However, a lipoma may become painful if it is located in an area where it pushes on nerves. How is this diagnosed? A lipoma can usually be diagnosed with a physical exam. You may also have tests to confirm the diagnosis and to rule out other conditions. Tests may include:  Imaging tests, such as a CT scan or MRI.  Removal of a tissue sample to be looked at under a microscope (biopsy).  How is this treated? Treatment is not needed for small lipomas that are not causing problems. If a lipoma continues to get bigger or it causes problems, removal is often the best option. Lipomas can also be removed to improve appearance. Removal of a lipoma is usually done with a surgery in which the fatty cells and the surrounding capsule are removed. Most often, a medicine that numbs the area (local anesthetic) is used for this procedure. Follow these instructions at home:  Keep all follow-up visits as directed by your health  care provider. This is important. Contact a health care provider if:  Your lipoma becomes larger or hard.  Your lipoma becomes painful, red, or increasingly swollen. These could be signs of infection or a more serious condition. This information is not intended to replace advice given to you by your health care provider. Make sure you discuss any questions you have with your health care provider. Document Released: 08/25/2002 Document Revised: 02/10/2016 Document Reviewed: 08/31/2014 Elsevier Interactive Patient Education  [employer].

## 2017-03-07 ENCOUNTER — Telehealth: Payer: Self-pay

## 2017-03-07 NOTE — Telephone Encounter (Signed)
-----   Message from Delano Metz, [place] sent at 03/01/2017  8:45 PM EDT ----- Quincy Carnes,  I have sent a telephone note to you regarding Ms. Correll. After reviewing the paperwork that she left for me, her exam findings do not support list of accommodations that she is requesting. She can follow up with neurology for evaluation to see if this is something to explore.   If you need me to speak with her, let me know. Almyra Free

## 2017-03-07 NOTE — Telephone Encounter (Signed)
Spoke to patient and voiced understanding that Jane Eaton is not able to fill the forms for her due to her lab work results being normal.

## 2017-03-08 ENCOUNTER — Ambulatory Visit: Payer: BLUE CROSS/BLUE SHIELD | Admitting: Family Medicine

## 2017-03-12 ENCOUNTER — Encounter: Payer: BLUE CROSS/BLUE SHIELD | Admitting: Family Medicine

## 2017-03-19 ENCOUNTER — Institutional Professional Consult (permissible substitution): Payer: Self-pay | Admitting: Neurology

## 2017-03-20 ENCOUNTER — Encounter: Payer: Self-pay | Admitting: Family Medicine

## 2017-03-20 NOTE — Telephone Encounter (Signed)
Called pt in regards to MyChart message she sent out this morning  about a referral to [facility], left a message for pt to return my call in the office.

## 2017-03-22 ENCOUNTER — Telehealth: Payer: Self-pay | Admitting: Family Medicine

## 2017-03-22 NOTE — Telephone Encounter (Signed)
Pt would like to see if Almyra Free could refer her to [facility]

## 2017-03-22 NOTE — Telephone Encounter (Signed)
Called pt left a message to return my call in the office in regards to her referral request.

## 2017-03-26 NOTE — Telephone Encounter (Signed)
Spoke with pt this morning stated that she needs a referral to [facility] due to her job.Pt stated that she has been out of work due to not being able to perform her duties which requires her to have a plan on how she needs to monitor Blood  Glucose.Pt was Advised that all her blood work done at the office is normal and does not require any referral since she is not diabetic. Pt was Advised to see her Neurology as recommended by Gregary Signs at her visit, Pt denied not having insurance and not being able to see her neurology. Pt was not pleased since she did not get the Referral and stated that she would look for another provider who could get her what she needs.

## 2017-03-26 NOTE — Telephone Encounter (Signed)
Pt returning your call.  Is available now for call back

## 2017-04-04 ENCOUNTER — Telehealth: Payer: Self-pay | Admitting: Family Medicine

## 2017-04-04 NOTE — Telephone Encounter (Signed)
Patient presented very upset with a letter that she asked me to make a copy of that states, "if we do not receive this paperwork by July 24th, we will be forced to proceed with termination of your employment".  The patient states that she dropped off ADA forms that [employer] returned blank.  She has asked to receive documentation to either support her return to work or receive a referral to endocrinology or Diabetes Education & Nutrition to be seen for her potential Hypoglycemia (as the primary dx shows on her 02/26/2017 visit) and states that neither has happened.  She states that she feels as though nothing has been done and now she is facing being terminated from her employer.  The patient has filled out paper work to have her records transferred for continuity of care with another provider who has scheduled an Endocrinology visit for her.  She states she feels as though she has been disregarded and that the seriousness of the matter has been overlooked.  Patient states that her place of employment is dangerous and has machinery that can dismember or even kill her making her a risk for the company, which is why they insist the patient's symptoms be addressed.

## 2017-04-05 NOTE — Telephone Encounter (Signed)
On 02/26/17: Patient was seen and evaluated for concern of possible hypoglycemia. Symptoms were reportedly noticed after severely restricting diet. She was advised to eat frequent meals to provide nutrition and calories needed. Lab work indicated normal blood sugar. Patient requested consideration for accomodation at work as she was concerned about low blood sugar. Advised patient that glucose was normal and dietary choices with adequate calories is recommended.  02/28/17: Patient was advised that lab work was normal and that history, exam, and findings did not support accommodations that were requested. 03/02/17: She was seen for CPE and no symptoms of hypoglycemia noted; BMP WNL 02/26/17. Advised frequent meals and if she is truly concerned she can keep glucose tablets on hand however lab work does not indicate hypoglycemia. Further advised follow if symptoms are noted. 03/08/17: Patient sought care at [facility] for concern for hypoglycemia and requesting that paperwork for accommodations for work be completed. She was advised to have frequent meals and notes indicate that no lifestyle recommendations of improving dietary choices were noted. Provider noted that due to history, assessment, and lab forms do not support need for disability paperwork.

## 2017-04-20 ENCOUNTER — Emergency Department (HOSPITAL_COMMUNITY): Payer: BLUE CROSS/BLUE SHIELD

## 2017-04-20 ENCOUNTER — Encounter (HOSPITAL_COMMUNITY): Payer: Self-pay | Admitting: *Deleted

## 2017-04-20 ENCOUNTER — Emergency Department (HOSPITAL_COMMUNITY)
Admission: EM | Admit: 2017-04-20 | Discharge: 2017-04-20 | Disposition: A | Discharge status: Home / Self Care | Payer: BLUE CROSS/BLUE SHIELD | Attending: Emergency Medicine | Admitting: Emergency Medicine

## 2017-04-20 DIAGNOSIS — R4182 Altered mental status, unspecified: Secondary | ICD-10-CM | POA: Diagnosis present

## 2017-04-20 DIAGNOSIS — Z79899 Other long term (current) drug therapy: Secondary | ICD-10-CM | POA: Diagnosis not present

## 2017-04-20 DIAGNOSIS — R55 Syncope and collapse: Secondary | ICD-10-CM | POA: Diagnosis not present

## 2017-04-20 DIAGNOSIS — F985 Adult onset fluency disorder: Secondary | ICD-10-CM | POA: Diagnosis not present

## 2017-04-20 LAB — CBC WITH DIFFERENTIAL/PLATELET
BASOS ABS: 0 10*3/uL (ref 0.0–0.1)
Basophils Relative: 0 %
Eosinophils Absolute: 0 10*3/uL (ref 0.0–0.7)
Eosinophils Relative: 0 %
HEMATOCRIT: 39.8 % (ref 36.0–46.0)
Hemoglobin: 12.8 g/dL (ref 12.0–15.0)
LYMPHS PCT: 38 %
Lymphs Abs: 2 10*3/uL (ref 0.7–4.0)
MCH: 26.4 pg (ref 26.0–34.0)
MCHC: 32.2 g/dL (ref 30.0–36.0)
MCV: 82.2 fL (ref 78.0–100.0)
MONO ABS: 0.2 10*3/uL (ref 0.1–1.0)
MONOS PCT: 4 %
Neutro Abs: 3 10*3/uL (ref 1.7–7.7)
Neutrophils Relative %: 58 %
Platelets: 305 10*3/uL (ref 150–400)
RBC: 4.84 MIL/uL (ref 3.87–5.11)
RDW: 13.2 % (ref 11.5–15.5)
WBC: 5.3 10*3/uL (ref 4.0–10.5)

## 2017-04-20 LAB — COMPREHENSIVE METABOLIC PANEL
ALT: 18 U/L (ref 14–54)
AST: 23 U/L (ref 15–41)
Albumin: 4.3 g/dL (ref 3.5–5.0)
Alkaline Phosphatase: 59 U/L (ref 38–126)
Anion gap: 8 (ref 5–15)
BILIRUBIN TOTAL: 0.7 mg/dL (ref 0.3–1.2)
BUN: 11 mg/dL (ref 6–20)
CO2: 27 mmol/L (ref 22–32)
Calcium: 9.6 mg/dL (ref 8.9–10.3)
Chloride: 103 mmol/L (ref 101–111)
Creatinine, Ser: 1.07 mg/dL — ABNORMAL HIGH (ref 0.44–1.00)
GFR calc non Af Amer: >60 mL/min (ref >60)
Glucose, Bld: 91 mg/dL (ref 65–99)
POTASSIUM: 3.7 mmol/L (ref 3.5–5.1)
Sodium: 138 mmol/L (ref 135–145)
Total Protein: 7.5 g/dL (ref 6.5–8.1)

## 2017-04-20 NOTE — ED Notes (Signed)
After drinking one sip of water from the nutrition room, pt states "I can't drink this-- is it purified?" minimal stuttering noted-- refused to drink any more water-- visitor and pt demanded bottled water.  Bottled water given for completion of swallow screen.

## 2017-04-20 NOTE — ED Notes (Signed)
Friends are at the bedside

## 2017-04-20 NOTE — Discharge Instructions (Signed)
Follow up with your primary doctor and neurologist as we discussed

## 2017-04-20 NOTE — ED Notes (Signed)
During this nurse's assessment - visitors were giggling when pt was attempting to talk and answer questions. Asked visitor to step outside door until after assessment, visitor started crying, visitor's mother left room, with visitor and went into waiting room. After assessment completed, this nurse spoke with visitors in waiting room, visitors would not look at this nurse, stated, "she is on her own, I am not going back in"

## 2017-04-20 NOTE — ED Triage Notes (Signed)
Per friend, pt had syncopal episode at work in May and pt having stuttering and not acting herself since then. Pt had lab work today and reported near syncopal episode while there, sent here for further eval.

## 2017-04-20 NOTE — ED Provider Notes (Signed)
[facility] DEPT Provider Note   CSN: [id number] Arrival date & time: 04/20/17  0945     History   Chief Complaint Chief Complaint  Patient presents with  . Altered Mental Status  . Near Syncope    HPI Jane Eaton is a 41 y.o. female.  HPI Pt states her sx started back on memorial day.  SHe had an episode at work where she became unresponsive.  Pt felt like she felt stuck.  She was able to walk to the lunch room but did not feel normal.   She felt like things were running slow.  Her hands felt like they were shaking. Since then she has been having with her speech.  She is stuttering.  SHe feels like the sx come back if she goes without eating.  In the past she has felt weak whenever her blood sugar runs low.  Pt states anytime her blood sugar is below 80 this will happen. She went to a doctors office today.  She had her blood sugar checked, it was in the 80s and 70s.  Pt had a near syncopal episode at the office while having blood tests.  She was sent to the ED for further evaluation  Patient provided additional history. She has not been able to work since June. She feels that when her blood sugar drops she is unsafe to continue working.  The patient's job has set meal times. She is not allowed to leave finger off the floor to get something to eat, she has paperwork from a doctor. Patient states her doctors will not fill out FMLA or other paperwork allowing her to go back to work. Pt is seeing an endocrinologist in [place] [facility]. Past Medical History:  Diagnosis Date  . Allergy   . Anemia   . Depression   . History of seizures   . UTI (urinary tract infection)     Patient Active Problem List   Diagnosis Date Noted  . VAGINITIS, CANDIDAL 04/08/2010  . URINALYSIS, ABNORMAL 04/05/2010  . 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 PERSISTENT DISORDER INIT/MAINTAINING WAKEFULNESS 03/31/2008  . FATIGUE 03/31/2008    Past Surgical History:  Procedure Laterality  Date  . CESAREAN SECTION      OB History    No data available       Home Medications    Prior to Admission medications   Medication Sig Start Date End Date Taking? Authorizing Provider  aspirin-acetaminophen-caffeine (EXCEDRIN MIGRAINE) [phone] MG tablet Take 1 tablet by mouth every 6 (six) hours as needed for headache.   Yes [provider]  cyclobenzaprine (FLEXERIL) 5 MG tablet Take 1 tablet (5 mg total) by mouth at bedtime. Patient not taking: Reported on 04/20/2017 11/06/16   Lysbeth Penner, FNP  naproxen (NAPROSYN) 500 MG tablet Take 1 tablet (500 mg total) by mouth 2 (two) times daily with a meal. Patient not taking: Reported on 04/20/2017 11/06/16   Lysbeth Penner, FNP  omeprazole (PRILOSEC) 20 MG capsule Take 1 capsule (20 mg total) by mouth daily. Patient not taking: Reported on 04/20/2017 11/06/16   Lysbeth Penner, FNP    Family History Family History  Problem Relation Age of Onset  . Hypertension Mother   . Diabetes Mother   . Alcohol abuse Father   . Heart disease Father   . Hyperlipidemia Father     Social History Social History  Substance Use Topics  . Smoking status: Never Smoker  . Smokeless tobacco: Never Used  .  Alcohol use No     Allergies   Patient has no known allergies.   Review of Systems Review of Systems  All other systems reviewed and are negative.    Physical Exam Updated Vital Signs BP 133/89 (BP Location: Right Arm)   Pulse 90   Temp 98.1 F (36.7 C)   Resp 16   Ht 1.549 m (5\' 1" )   Wt 59 kg (130 lb)   SpO2 100%   BMI 24.56 kg/m   Physical Exam  Constitutional: She is oriented to person, place, and time. She appears well-developed and well-nourished. No distress.  HENT:  Head: Normocephalic and atraumatic.  Right Ear: External ear normal.  Left Ear: External ear normal.  Mouth/Throat: Oropharynx is clear and moist.  Eyes: Conjunctivae are normal. Right eye exhibits no discharge. Left eye exhibits no  discharge. No scleral icterus.  Neck: Neck supple. No tracheal deviation present.  Cardiovascular: Normal rate, regular rhythm and intact distal pulses.   Pulmonary/Chest: Effort normal and breath sounds normal. No stridor. No respiratory distress. She has no wheezes. She has no rales.  Abdominal: Soft. Bowel sounds are normal. She exhibits no distension. There is no tenderness. There is no rebound and no guarding.  Musculoskeletal: She exhibits no edema or tenderness.  Neurological: She is alert and oriented to person, place, and time. She has normal strength. No cranial nerve deficit (no facial droop, extraocular movements intact, no slurred speech , patient is stuttering) or sensory deficit. She exhibits normal muscle tone. She displays no seizure activity. Coordination normal.  No pronator drift bilateral upper extrem, able to hold both legs off bed for 5 seconds, sensation intact in all extremities, no visual field cuts, no left or right sided neglect, normal finger-nose exam bilaterally, no nystagmus noted   Skin: Skin is warm and dry. No rash noted.  Psychiatric: She has a normal mood and affect.  Nursing note and vitals reviewed.    ED Treatments / Results  Labs (all labs ordered are listed, but only abnormal results are displayed) Labs Reviewed  COMPREHENSIVE METABOLIC PANEL - Abnormal; Notable for the following:       Result Value   Creatinine, Ser 1.07 (*)    All other components within normal limits  CBC WITH DIFFERENTIAL/PLATELET    EKG  EKG Interpretation None       Radiology Ct Head Wo Contrast  Result Date: 04/20/2017 CLINICAL DATA:  Syncope.  Focal new neurologic deficit EXAM: CT HEAD WITHOUT CONTRAST TECHNIQUE: Contiguous axial images were obtained from the base of the skull through the vertex without intravenous contrast. COMPARISON:  None. FINDINGS: Brain: No acute intracranial abnormality. Specifically, no hemorrhage, hydrocephalus, mass lesion, acute  infarction, or significant intracranial injury. Vascular: No hyperdense vessel or unexpected calcification. Skull: No acute calvarial abnormality. Sinuses/Orbits: Visualized paranasal sinuses and mastoids clear. Orbital soft tissues unremarkable. Other: None IMPRESSION: Normal study. Electronically Signed   By: Rolm Baptise M.D.   On: 04/20/2017 11:13    Procedures Procedures (including critical care time)  Medications Ordered in ED Medications - No data to display   Initial Impression / Assessment and Plan / ED Course  I have reviewed the triage vital signs and the nursing notes.  Pertinent labs & imaging results that were available during my care of the patient were reviewed by me and considered in my medical decision making (see chart for details).   patient's ED visit is reassuring. She is not showing any signs of acute infection. No  focal neurologic symptoms consistent with stroke.  Pt's blood sugar here is normal.  Patient has a stutter that did continue throughout the ED visit.  Recommend she follow up with her primary care doctor and her neurologist dr Maureen Chatters.   Final Clinical Impressions(s) / ED Diagnoses   Final diagnoses:  Adult stuttering    New Prescriptions New Prescriptions   No medications on file     Dorie Rank, MD 04/20/17 1241

## 2017-04-20 NOTE — ED Notes (Signed)
RN Provider at bedside.

## 2017-05-17 ENCOUNTER — Encounter (HOSPITAL_COMMUNITY): Payer: Self-pay | Admitting: Emergency Medicine

## 2017-05-17 ENCOUNTER — Emergency Department (HOSPITAL_COMMUNITY)
Admission: EM | Admit: 2017-05-17 | Discharge: 2017-05-17 | Disposition: A | Discharge status: Home / Self Care | Payer: BLUE CROSS/BLUE SHIELD | Attending: Emergency Medicine | Admitting: Emergency Medicine

## 2017-05-17 DIAGNOSIS — R404 Transient alteration of awareness: Secondary | ICD-10-CM

## 2017-05-17 DIAGNOSIS — E876 Hypokalemia: Secondary | ICD-10-CM

## 2017-05-17 LAB — CBC WITH DIFFERENTIAL/PLATELET
BASOS PCT: 0 %
Basophils Absolute: 0 10*3/uL (ref 0.0–0.1)
EOS PCT: 0 %
Eosinophils Absolute: 0 10*3/uL (ref 0.0–0.7)
HCT: 38.2 % (ref 36.0–46.0)
Hemoglobin: 12.8 g/dL (ref 12.0–15.0)
LYMPHS ABS: 1.8 10*3/uL (ref 0.7–4.0)
Lymphocytes Relative: 17 %
MCH: 27 pg (ref 26.0–34.0)
MCHC: 33.5 g/dL (ref 30.0–36.0)
MCV: 80.6 fL (ref 78.0–100.0)
MONO ABS: 0.3 10*3/uL (ref 0.1–1.0)
Monocytes Relative: 3 %
Neutro Abs: 8.2 10*3/uL — ABNORMAL HIGH (ref 1.7–7.7)
Neutrophils Relative %: 80 %
Platelets: 319 10*3/uL (ref 150–400)
RBC: 4.74 MIL/uL (ref 3.87–5.11)
RDW: 13.1 % (ref 11.5–15.5)
WBC: 10.2 10*3/uL (ref 4.0–10.5)

## 2017-05-17 LAB — I-STAT CHEM 8, ED
BUN: 10 mg/dL (ref 6–20)
CALCIUM ION: 1.12 mmol/L — AB (ref 1.15–1.40)
Chloride: 102 mmol/L (ref 101–111)
Creatinine, Ser: 0.9 mg/dL (ref 0.44–1.00)
Glucose, Bld: 88 mg/dL (ref 65–99)
HEMATOCRIT: 40 % (ref 36.0–46.0)
HEMOGLOBIN: 13.6 g/dL (ref 12.0–15.0)
Potassium: 3.2 mmol/L — ABNORMAL LOW (ref 3.5–5.1)
Sodium: 140 mmol/L (ref 135–145)
TCO2: 24 mmol/L (ref 22–32)

## 2017-05-17 LAB — I-STAT BETA HCG BLOOD, ED (MC, WL, AP ONLY): I-stat hCG, quantitative: <5 m[IU]/mL (ref <5)

## 2017-05-17 MED ORDER — POTASSIUM CHLORIDE CRYS ER 20 MEQ PO TBCR
40.0000 meq | EXTENDED_RELEASE_TABLET | Freq: Once | ORAL | Status: AC
  Administered 2017-05-17: 40 meq via ORAL
  Filled 2017-05-17: qty 2

## 2017-05-17 NOTE — Discharge Instructions (Signed)
Please follow up with neurology. Return if worsening symptoms.

## 2017-05-17 NOTE — ED Provider Notes (Signed)
[facility] DEPT Provider Note   CSN: [id number] Arrival date & time: 05/17/17  1510     History   Chief Complaint Chief Complaint  Patient presents with  . Anxiety  . Altered Mental Status    HPI Jane Eaton is a 41 y.o. female.  HPI Jane Eaton is a 41 y.o. femalepresents to emergency department complaining of episode of altered awareness and generalized paralysis which occurred just prior to arrival after she received some bad news in court. 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 She states that she is followed by neurology. She is currently not on any medications for this. States today as she was walking out of the court room, she felt weak, her body became tingly, and she collapsed. She was unable to speak or move, but states she could hear.  Reports this is a 3rd episode of paralysis while awake ever, two other ones were years ago. States that she normally gets this when she is asleep. She states sometimes she wakes up and unable to move or speak, and states sometimes she would likely like this for 16 hours. She states it is becoming so bad that she is scared to go to sleep alone because she is worried she would not wake up and be in paralized stated for prolonged time with no one to help her. Patient also states she is dealing with hypoglycemia. She states that she is currently seeing an endocrinologist. She states when her sugar drops below 90 she gets weakness, shakes, and sometimes syncopal episodes. She was seen for this syncopal episode a few weeks ago here in ED, her sugar was normal then. She states "you're normal values is not what's normal for me." She states "i cant live like this but no one would give me a disability."  States she has not seen her neurologist because "she is not doing anything to help me."   Past Medical History:  Diagnosis Date  . Allergy   . Anemia   . Depression   . History of seizures   .  UTI (urinary tract infection)     Patient Active Problem List   Diagnosis Date Noted  . VAGINITIS, CANDIDAL 04/08/2010  . URINALYSIS, ABNORMAL 04/05/2010  . 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 PERSISTENT DISORDER INIT/MAINTAINING WAKEFULNESS 03/31/2008  . FATIGUE 03/31/2008    Past Surgical History:  Procedure Laterality Date  . CESAREAN SECTION      OB History    No data available       Home Medications    Prior to Admission medications   Medication Sig Start Date End Date Taking? Authorizing Provider  aspirin-acetaminophen-caffeine (EXCEDRIN MIGRAINE) [phone] MG tablet Take 1 tablet by mouth every 6 (six) hours as needed for headache.   Yes [provider]  cyclobenzaprine (FLEXERIL) 5 MG tablet Take 1 tablet (5 mg total) by mouth at bedtime. Patient not taking: Reported on 04/20/2017 11/06/16   Lysbeth Penner, FNP  naproxen (NAPROSYN) 500 MG tablet Take 1 tablet (500 mg total) by mouth 2 (two) times daily with a meal. Patient not taking: Reported on 04/20/2017 11/06/16   Lysbeth Penner, FNP  omeprazole (PRILOSEC) 20 MG capsule Take 1 capsule (20 mg total) by mouth daily. Patient not taking: Reported on 04/20/2017 11/06/16   Lysbeth Penner, FNP    Family History Family History  Problem Relation Age of Onset  . Hypertension Mother   . Diabetes Mother   .  Alcohol abuse Father   . Heart disease Father   . Hyperlipidemia Father     Social History Social History  Substance Use Topics  . Smoking status: Never Smoker  . Smokeless tobacco: Never Used  . Alcohol use No     Allergies   Patient has no known allergies.   Review of Systems Review of Systems  Constitutional: Negative for chills and fever.  Respiratory: Negative for cough, chest tightness and shortness of breath.   Cardiovascular: Negative for chest pain, palpitations and leg swelling.  Gastrointestinal: Negative for abdominal pain, diarrhea, nausea and vomiting.    Genitourinary: Negative for dysuria, flank pain and pelvic pain.  Musculoskeletal: Negative for arthralgias, myalgias, neck pain and neck stiffness.  Skin: Negative for rash.  Neurological: Positive for syncope. Negative for dizziness, weakness and headaches.  All other systems reviewed and are negative.    Physical Exam Updated Vital Signs BP 140/80 (BP Location: Left Arm)   Pulse 79   Temp 98.8 F (37.1 C) (Oral)   Resp 16   Ht 5\' 1"  (1.549 m)   Wt 59 kg (130 lb)   LMP 05/09/2017   SpO2 100%   BMI 24.56 kg/m   Physical Exam  Constitutional: She is oriented to person, place, and time. She appears well-developed and well-nourished. No distress.  HENT:  Head: Normocephalic.  Eyes: Conjunctivae are normal.  Neck: Neck supple.  Cardiovascular: Normal rate, regular rhythm and normal heart sounds.   Pulmonary/Chest: Effort normal and breath sounds normal. No respiratory distress. She has no wheezes. She has no rales.  Abdominal: Soft. Bowel sounds are normal. She exhibits no distension. There is no tenderness. There is no rebound.  Musculoskeletal: She exhibits no edema.  Neurological: She is alert and oriented to person, place, and time.  5/5 and equal upper and lower extremity strength bilaterally. Equal grip strength bilaterally. Normal finger to nose and heel to shin. No pronator drift.   Skin: Skin is warm and dry.  Psychiatric: She has a normal mood and affect. Her behavior is normal.  Nursing note and vitals reviewed.    ED Treatments / Results  Labs (all labs ordered are listed, but only abnormal results are displayed) Labs Reviewed  CBC WITH DIFFERENTIAL/PLATELET - Abnormal; Notable for the following:       Result Value   Neutro Abs 8.2 (*)    All other components within normal limits  I-STAT CHEM 8, ED - Abnormal; Notable for the following:    Potassium 3.2 (*)    Calcium, Ion 1.12 (*)    All other components within normal limits  I-STAT BETA HCG BLOOD, ED  ([facility], [facility], [facility] ONLY)    EKG  EKG Interpretation None       Radiology No results found.  Procedures Procedures (including critical care time)  Medications Ordered in ED Medications - No data to display   Initial Impression / Assessment and Plan / ED Course  I have reviewed the triage vital signs and the nursing notes.  Pertinent labs & imaging results that were available during my care of the patient were reviewed by me and considered in my medical decision making (see chart for details).     Patient emergency department with episode of altered awareness and full body paralysis lasting a few minutes. She is back to normal. Her exam is unremarkable. Recent visit to the ED for similarly described symptoms but at that time she states her problem was her low blood sugar. Her  blood sugar by EMS was 90. Will check basic labs to rule out anemia check electrolytes, will get EKG and pregnancy test.  Labs unremarkable, EKG normal. Plan to discharge home with close outpatient follow-up with her neurologist. Return precautions discussed. Patient has been normal in department, normal vital signs.  Vitals:   05/17/17 1520 05/17/17 1821 05/17/17 2031  BP: 129/87 140/80 110/61  Pulse: 100 79 84  Resp: 18 16 15   Temp:  98.8 F (37.1 C) 98.7 F (37.1 C)  TempSrc: Oral Oral Oral  SpO2: 95% 100% 100%  Weight: 59 kg (130 lb)    Height: 5\' 1"  (1.549 m)       Final Clinical Impressions(s) / ED Diagnoses   Final diagnoses:  Transient alteration of awareness  Hypokalemia    New Prescriptions Discharge Medication List as of 05/17/2017  8:18 PM       Jeannett Senior, PA-C 05/18/17 0043    Tanna Furry, MD 06/02/17 2033

## 2017-05-17 NOTE — ED Triage Notes (Signed)
Per [facility] pt was picked up at court house where she had a case and after getting bad news, she had AMS. Per EMS patient wouldn't speak until they got her in the truck where she asked for a blanket, but wouldn't answer any other questions.  Patient came in with 18g in left [facility] that will be taken out before patient goes to lobby to wait for available examination/treatment room.  Vitals: 103/75, 100HR, 96% RA, 20R, CBG 80.

## 2017-05-17 NOTE — ED Triage Notes (Signed)
Patient reports that she was walking out of court room when her body collapsed and went numb, she denies any LOC because aware of what was going on around her but unable to verbally respond. Patient reports has PMH narcolepsy since age 41 but due to insurance issues hasnt been able to get into neurologist in past 8-10 years.

## 2017-05-24 ENCOUNTER — Institutional Professional Consult (permissible substitution): Payer: Self-pay | Admitting: Neurology

## 2017-06-07 ENCOUNTER — Encounter: Payer: Self-pay | Admitting: Family Medicine

## 2017-06-25 ENCOUNTER — Telehealth: Payer: Self-pay | Admitting: Neurology

## 2017-06-25 ENCOUNTER — Institutional Professional Consult (permissible substitution): Payer: Self-pay | Admitting: Neurology

## 2017-06-25 NOTE — Telephone Encounter (Signed)
Pt no showed apt today 

## 2017-06-26 ENCOUNTER — Encounter: Payer: Self-pay | Admitting: Neurology

## 2017-07-02 ENCOUNTER — Telehealth: Payer: Self-pay | Admitting: Neurology

## 2017-07-02 NOTE — Telephone Encounter (Signed)
Please dismiss, Angie . CD

## 2017-07-02 NOTE — Telephone Encounter (Signed)
Pt cancelled without giving 24 hour notice on 05/24/17. Pt then no showed on 06/25/17. Does Dr.Dohmeier want to dismiss pt?

## 2017-07-03 ENCOUNTER — Encounter: Payer: Self-pay | Admitting: Neurology

## 2018-07-15 ENCOUNTER — Ambulatory Visit (HOSPITAL_COMMUNITY)
Admission: EM | Admit: 2018-07-15 | Discharge: 2018-07-15 | Disposition: A | Discharge status: Home / Self Care | Payer: Self-pay | Attending: Family Medicine | Admitting: Family Medicine

## 2018-07-15 ENCOUNTER — Encounter (HOSPITAL_COMMUNITY): Payer: Self-pay | Admitting: Emergency Medicine

## 2018-07-15 ENCOUNTER — Ambulatory Visit (INDEPENDENT_AMBULATORY_CARE_PROVIDER_SITE_OTHER): Payer: Self-pay

## 2018-07-15 ENCOUNTER — Other Ambulatory Visit: Payer: Self-pay

## 2018-07-15 DIAGNOSIS — R05 Cough: Secondary | ICD-10-CM

## 2018-07-15 DIAGNOSIS — R0602 Shortness of breath: Secondary | ICD-10-CM

## 2018-07-15 DIAGNOSIS — G44209 Tension-type headache, unspecified, not intractable: Secondary | ICD-10-CM

## 2018-07-15 DIAGNOSIS — J22 Unspecified acute lower respiratory infection: Secondary | ICD-10-CM

## 2018-07-15 DIAGNOSIS — Z77120 Contact with and (suspected) exposure to mold (toxic): Secondary | ICD-10-CM

## 2018-07-15 HISTORY — DX: Other sleep disorders: G47.8

## 2018-07-15 HISTORY — DX: Narcolepsy without cataplexy: G47.419

## 2018-07-15 MED ORDER — DEXAMETHASONE SODIUM PHOSPHATE 10 MG/ML IJ SOLN
10.0000 mg | Freq: Once | INTRAMUSCULAR | Status: AC
  Administered 2018-07-15: 10 mg via INTRAMUSCULAR

## 2018-07-15 MED ORDER — AZITHROMYCIN 250 MG PO TABS: 250.0000 mg | ORAL_TABLET | Freq: Every day | ORAL | 0 refills | Status: DC

## 2018-07-15 MED ORDER — KETOROLAC TROMETHAMINE 60 MG/2ML IM SOLN
INTRAMUSCULAR | Status: AC
  Filled 2018-07-15: qty 2

## 2018-07-15 MED ORDER — BENZONATATE 200 MG PO CAPS: 200.0000 mg | ORAL_CAPSULE | Freq: Three times a day (TID) | ORAL | 0 refills | Status: AC | PRN

## 2018-07-15 MED ORDER — DEXAMETHASONE SODIUM PHOSPHATE 10 MG/ML IJ SOLN
INTRAMUSCULAR | Status: AC
  Filled 2018-07-15: qty 1

## 2018-07-15 MED ORDER — KETOROLAC TROMETHAMINE 60 MG/2ML IM SOLN
60.0000 mg | Freq: Once | INTRAMUSCULAR | Status: AC
  Administered 2018-07-15: 60 mg via INTRAMUSCULAR

## 2018-07-15 MED ORDER — NAPROXEN 500 MG PO TABS: 500.0000 mg | ORAL_TABLET | Freq: Two times a day (BID) | ORAL | 0 refills | Status: DC

## 2018-07-15 NOTE — ED Notes (Signed)
Patient laughing in lobby, smiling and talking.  Patient responded to intake nurse "wait a minute" when patient name called twice to in-take room

## 2018-07-15 NOTE — ED Provider Notes (Signed)
[place]    CSN: [id number] Arrival date & time: 07/15/18  1746     History   Chief Complaint Chief Complaint  Patient presents with  . Headache    HPI Jane Eaton is a 42 y.o. female history of narcolepsy, sleep paralysis, presenting today for evaluation of headache and cough.  Patient states that she has had a cough for over 1 week.  Cough has been dry.  Has had some mild associated sore throat and congestion, but main concern is the cough.  Denies any fevers.  Has had occasional shortness of breath.  She has not taken anything for her symptoms.  Recently found out she had exposure to mold from her housing situation.  She has also had a headache since Friday.  She relates an episode of approximately 40 minutes for she lost consciousness versus fell asleep.  States that she has a history of narcolepsy and sleep paralysis and felt similar to this.  Episode was not witnessed.  Denied persistent weakness, changes in vision.  But woke up with a slight tingling sensation in her hands and feet.  She has had a persistent headache since this.  Headache onset was gradual and has been persistent.  Headache has not worsened since beginning.  Has tried taking Aleve without relief.  Denies associated vision changes, nausea or vomiting.  Denies associated photophobia or phonophobia.  HPI  Past Medical History:  Diagnosis Date  . Allergy   . Anemia   . Depression   . History of seizures   . Narcolepsy   . Sleep paralysis   . UTI (urinary tract infection)     Patient Active Problem List   Diagnosis Date Noted  . VAGINITIS, CANDIDAL 04/08/2010  . URINALYSIS, ABNORMAL 04/05/2010  . 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 PERSISTENT DISORDER INIT/MAINTAINING WAKEFULNESS 03/31/2008  . FATIGUE 03/31/2008    Past Surgical History:  Procedure Laterality Date  . CESAREAN SECTION    . WISDOM TOOTH EXTRACTION      OB History   None      Home  Medications    Prior to Admission medications   Medication Sig Start Date End Date Taking? Authorizing Provider  aspirin-acetaminophen-caffeine (EXCEDRIN MIGRAINE) [phone] MG tablet Take 1 tablet by mouth every 6 (six) hours as needed for headache.    [provider]  azithromycin (ZITHROMAX) 250 MG tablet Take 1 tablet (250 mg total) by mouth daily. Take first 2 tablets together, then 1 every day until finished. 07/15/18   Ah Bott C, PA-C  benzonatate (TESSALON) 200 MG capsule Take 1 capsule (200 mg total) by mouth 3 (three) times daily as needed for up to 7 days for cough. 07/15/18 07/22/18  Kaydense Rizo C, PA-C  naproxen (NAPROSYN) 500 MG tablet Take 1 tablet (500 mg total) by mouth 2 (two) times daily. 07/15/18   Roya Gieselman, Elesa Hacker, PA-C    Family History Family History  Problem Relation Age of Onset  . Hypertension Mother   . Diabetes Mother   . Alcohol abuse Father   . Heart disease Father   . Hyperlipidemia Father     Social History Social History   Tobacco Use  . Smoking status: Never Smoker  . Smokeless tobacco: Never Used  Substance Use Topics  . Alcohol use: No  . Drug use: No     Allergies   Patient has no known allergies.   Review of Systems Review of Systems  Constitutional: Positive for fatigue.  Negative for activity change, appetite change, chills and fever.  HENT: Positive for congestion. Negative for ear pain, rhinorrhea, sinus pressure, sore throat and trouble swallowing.   Eyes: Negative for photophobia, pain, discharge, redness and visual disturbance.  Respiratory: Positive for cough and shortness of breath. Negative for chest tightness.   Cardiovascular: Negative for chest pain.  Gastrointestinal: Negative for abdominal pain, diarrhea, nausea and vomiting.  Genitourinary: Negative for decreased urine volume and hematuria.  Musculoskeletal: Negative for myalgias, neck pain and neck stiffness.  Skin: Negative for rash.    Neurological: Positive for headaches. Negative for dizziness, syncope, facial asymmetry, speech difficulty, weakness, light-headedness and numbness.     Physical Exam Triage Vital Signs ED Triage Vitals  Enc Vitals Group     BP 07/15/18 1830 124/65     Pulse Rate 07/15/18 1830 66     Resp 07/15/18 1830 18     Temp 07/15/18 1830 (!) 97.5 F (36.4 C)     Temp Source 07/15/18 1830 Oral     SpO2 07/15/18 1830 100 %     Weight --      Height --      Head Circumference --      Peak Flow --      Pain Score 07/15/18 1827 4     Pain Loc --      Pain Edu? --      Excl. in [place]? --    No data found.  Updated Vital Signs BP 124/65 (BP Location: Right Arm)   Pulse 66   Temp (!) 97.5 F (36.4 C) (Oral)   Resp 18   LMP 07/05/2018   SpO2 100%   Visual Acuity Right Eye Distance:   Left Eye Distance:   Bilateral Distance:    Right Eye Near:   Left Eye Near:    Bilateral Near:     Physical Exam  Constitutional: She is oriented to person, place, and time. She appears well-developed and well-nourished. No distress.  HENT:  Head: Normocephalic and atraumatic.  Mouth/Throat: Oropharynx is clear and moist.  Bilateral ears without tenderness to palpation of external auricle, tragus and mastoid, EAC's without erythema or swelling, TM's with good bony landmarks and cone of light. Non erythematous.  Oral mucosa pink and moist, no tonsillar enlargement or exudate. Posterior pharynx patent and nonerythematous, no uvula deviation or swelling. Normal phonation.  Eyes: Pupils are equal, round, and reactive to light. Conjunctivae and EOM are normal.  Neck: Neck supple.  Cardiovascular: Normal rate and regular rhythm.  No murmur heard. Pulmonary/Chest: Effort normal and breath sounds normal. No respiratory distress.  Breathing comfortably at rest, CTABL, no wheezing, rales or other adventitious sounds auscultated  Frequent dry cough  Abdominal: Soft. There is no tenderness.   Musculoskeletal: She exhibits no edema.  Neurological: She is alert and oriented to person, place, and time.  Patient A&O x3, cranial nerves II-XII grossly intact, strength at shoulders, hips and knees 5/5, equal bilaterally, patellar reflex 2+ bilaterally. Negative Romberg and Pronator Drift. Gait without abnormality.  Skin: Skin is warm and dry.  Psychiatric: She has a normal mood and affect.  Nursing note and vitals reviewed.    UC Treatments / Results  Labs (all labs ordered are listed, but only abnormal results are displayed) Labs Reviewed - No data to display  EKG None  Radiology Dg Chest 2 View  Result Date: 07/15/2018 CLINICAL DATA:  Cough and shortness of breath for 1 week. EXAM: CHEST - 2 VIEW COMPARISON:  None. FINDINGS: The cardiomediastinal silhouette is unremarkable. There is no evidence of focal airspace disease, pulmonary edema, suspicious pulmonary nodule/mass, pleural effusion, or pneumothorax. No acute bony abnormalities are identified. IMPRESSION: No active cardiopulmonary disease. Electronically Signed   By: Margarette Canada M.D.   On: 07/15/2018 19:35    Procedures Procedures (including critical care time)  Medications Ordered in UC Medications  ketorolac (TORADOL) injection 60 mg (60 mg Intramuscular Given 07/15/18 2009)  dexamethasone (DECADRON) injection 10 mg (10 mg Intramuscular Given 07/15/18 2009)    Initial Impression / Assessment and Plan / UC Course  I have reviewed the triage vital signs and the nursing notes.  Pertinent labs & imaging results that were available during my care of the patient were reviewed by me and considered in my medical decision making (see chart for details).     Chest x-ray obtained given cough greater than 1 week and mold exposure.  Negative.  Most likely viral cause versus atypical respiratory infection.  Will provide azithromycin, begin 2 tablets today, 1 tablet for the following 4 days.  Tessalon for cough.  Headache  without any neuro deficits or red flags.  Will treat with Toradol and Decadron in clinic today.  Continue management with anti-inflammatories at home.  Episode seems consistent with previous episodes of narcolepsy/unable to maintain wakefulness.  No neuro deficits in clinic today.  Episode happened 4 days ago.  Advised patient to go to emergency room if having another episode.Discussed strict return precautions. Patient verbalized understanding and is agreeable with plan.  Final Clinical Impressions(s) / UC Diagnoses   Final diagnoses:  Acute non intractable tension-type headache  Lower respiratory infection (e.g., bronchitis, pneumonia, pneumonitis, pulmonitis)     Discharge Instructions     We gave you a shot of Toradol and Decadron today for your headache.  They should begin working in approximately 30 to 40 minutes  Please use Naprosyn or ibuprofen 800 with Tylenol for further management headache at home  For your cough the Decadron may help, but please begin taking azithromycin.  Please pick take 2 tablets on day 1, 1 tablet for the following 4 days Please use Tessalon as needed for cough, as alternative you may's over-the-counter Robitussin, Delsym or Robitussin-DM  Please follow-up if cough persisting, developing worsening headache, change in vision, weakness, difficulty breathing, chest discomfort, shortness of breath  Physical to emergency room if having another episode of loss of consciousness     ED Prescriptions    Medication Sig Dispense Auth. Provider   naproxen (NAPROSYN) 500 MG tablet Take 1 tablet (500 mg total) by mouth 2 (two) times daily. 30 tablet Nathanyl Andujo C, PA-C   azithromycin (ZITHROMAX) 250 MG tablet Take 1 tablet (250 mg total) by mouth daily. Take first 2 tablets together, then 1 every day until finished. 6 tablet Jayant Kriz C, PA-C   benzonatate (TESSALON) 200 MG capsule Take 1 capsule (200 mg total) by mouth 3 (three) times daily as needed for up  to 7 days for cough. 28 capsule Aedyn Kempfer C, PA-C     Controlled Substance Prescriptions [place] Controlled Substance Registry consulted? Not Applicable   Janith Lima, [place] 07/15/18 2018

## 2018-07-15 NOTE — Discharge Instructions (Signed)
We gave you a shot of Toradol and Decadron today for your headache.  They should begin working in approximately 30 to 40 minutes  Please use Naprosyn or ibuprofen 800 with Tylenol for further management headache at home  For your cough the Decadron may help, but please begin taking azithromycin.  Please pick take 2 tablets on day 1, 1 tablet for the following 4 days Please use Tessalon as needed for cough, as alternative you may's over-the-counter Robitussin, Delsym or Robitussin-DM  Please follow-up if cough persisting, developing worsening headache, change in vision, weakness, difficulty breathing, chest discomfort, shortness of breath  Physical to emergency room if having another episode of loss of consciousness

## 2018-07-15 NOTE — ED Triage Notes (Addendum)
Head pain since Friday, persistent cough that started last week.   Patient says she has seizures.  Had a seizure on Friday, this is the [place] and has not seen neurologist in 10 years

## 2018-07-29 ENCOUNTER — Other Ambulatory Visit: Payer: Self-pay

## 2018-07-29 ENCOUNTER — Emergency Department (HOSPITAL_COMMUNITY)
Admission: EM | Admit: 2018-07-29 | Discharge: 2018-07-30 | Disposition: A | Discharge status: Home / Self Care | Payer: Self-pay | Attending: Emergency Medicine | Admitting: Emergency Medicine

## 2018-07-29 ENCOUNTER — Encounter (HOSPITAL_COMMUNITY): Payer: Self-pay

## 2018-07-29 DIAGNOSIS — R42 Dizziness and giddiness: Secondary | ICD-10-CM | POA: Insufficient documentation

## 2018-07-29 DIAGNOSIS — G43809 Other migraine, not intractable, without status migrainosus: Secondary | ICD-10-CM | POA: Insufficient documentation

## 2018-07-29 DIAGNOSIS — R55 Syncope and collapse: Secondary | ICD-10-CM | POA: Insufficient documentation

## 2018-07-29 NOTE — ED Triage Notes (Signed)
Pt here for feeling dizzy and having a progressively worsening headache since 2100 today. Hx of migraines. No neuro deficits. A&Ox4, VAN negative

## 2018-07-30 ENCOUNTER — Emergency Department (HOSPITAL_COMMUNITY): Payer: Self-pay

## 2018-07-30 LAB — URINALYSIS, ROUTINE W REFLEX MICROSCOPIC
Bacteria, UA: NONE SEEN
Bilirubin Urine: NEGATIVE
GLUCOSE, UA: NEGATIVE mg/dL
Ketones, ur: NEGATIVE mg/dL
Leukocytes, UA: NEGATIVE
Nitrite: NEGATIVE
Protein, ur: NEGATIVE mg/dL
Specific Gravity, Urine: 1.005 (ref 1.005–1.030)
pH: 7 (ref 5.0–8.0)

## 2018-07-30 LAB — CBG MONITORING, ED: Glucose-Capillary: 84 mg/dL (ref 70–99)

## 2018-07-30 LAB — CBC
HEMATOCRIT: 40.5 % (ref 36.0–46.0)
Hemoglobin: 12.7 g/dL (ref 12.0–15.0)
MCH: 26.4 pg (ref 26.0–34.0)
MCHC: 31.4 g/dL (ref 30.0–36.0)
MCV: 84.2 fL (ref 80.0–100.0)
Platelets: 286 10*3/uL (ref 150–400)
RBC: 4.81 MIL/uL (ref 3.87–5.11)
RDW: 12.9 % (ref 11.5–15.5)
WBC: 7.7 10*3/uL (ref 4.0–10.5)
nRBC: 0 % (ref 0.0–0.2)

## 2018-07-30 LAB — HCG, SERUM, QUALITATIVE: Preg, Serum: POSITIVE — AB

## 2018-07-30 LAB — BASIC METABOLIC PANEL
Anion gap: 8 (ref 5–15)
BUN: 13 mg/dL (ref 6–20)
CO2: 27 mmol/L (ref 22–32)
CREATININE: 0.99 mg/dL (ref 0.44–1.00)
Calcium: 9.5 mg/dL (ref 8.9–10.3)
Chloride: 103 mmol/L (ref 98–111)
GFR calc non Af Amer: >60 mL/min (ref >60)
GLUCOSE: 96 mg/dL (ref 70–99)
Potassium: 3.5 mmol/L (ref 3.5–5.1)
Sodium: 138 mmol/L (ref 135–145)

## 2018-07-30 LAB — I-STAT BETA HCG BLOOD, ED (MC, WL, AP ONLY): I-stat hCG, quantitative: 10.1 m[IU]/mL — ABNORMAL HIGH (ref <5)

## 2018-07-30 MED ORDER — VALPROATE SODIUM 500 MG/5ML IV SOLN
500.0000 mg | Freq: Once | INTRAVENOUS | Status: AC
  Administered 2018-07-30: 500 mg via INTRAVENOUS
  Filled 2018-07-30: qty 5

## 2018-07-30 MED ORDER — DEXAMETHASONE SODIUM PHOSPHATE 10 MG/ML IJ SOLN
10.0000 mg | Freq: Once | INTRAMUSCULAR | Status: AC
  Administered 2018-07-30: 10 mg via INTRAVENOUS
  Filled 2018-07-30: qty 1

## 2018-07-30 MED ORDER — SODIUM CHLORIDE 0.9 % IV BOLUS
1000.0000 mL | Freq: Once | INTRAVENOUS | Status: AC
  Administered 2018-07-30: 1000 mL via INTRAVENOUS

## 2018-07-30 MED ORDER — PROCHLORPERAZINE EDISYLATE 10 MG/2ML IJ SOLN
10.0000 mg | Freq: Once | INTRAMUSCULAR | Status: AC
  Administered 2018-07-30: 10 mg via INTRAVENOUS
  Filled 2018-07-30: qty 2

## 2018-07-30 MED ORDER — KETOROLAC TROMETHAMINE 30 MG/ML IJ SOLN
30.0000 mg | Freq: Once | INTRAMUSCULAR | Status: AC
  Administered 2018-07-30: 30 mg via INTRAVENOUS
  Filled 2018-07-30: qty 1

## 2018-07-30 MED ORDER — DIPHENHYDRAMINE HCL 50 MG/ML IJ SOLN
25.0000 mg | Freq: Once | INTRAMUSCULAR | Status: AC
  Administered 2018-07-30: 25 mg via INTRAVENOUS
  Filled 2018-07-30: qty 1

## 2018-07-30 NOTE — ED Notes (Signed)
Patient transported to CT scan . 

## 2018-07-30 NOTE — ED Provider Notes (Signed)
[place] EMERGENCY DEPARTMENT Provider Note   CSN: [id number] Arrival date & time: 07/29/18  2316     History   Chief Complaint Chief Complaint  Patient presents with  . Headache  . Dizziness    HPI Jane Eaton is a 42 y.o. female.  Patient presents to the emergency department for headache and dizziness.  Patient has a history of migraines.  Started to have a headache at 9:00 tonight.  Pain is throbbing and diffuse.  Headache is similar to previous migraines, however, she did have a syncopal episode tonight.  Patient has had multiple recurrent syncopal episodes in the past.      Past Medical History:  Diagnosis Date  . Allergy   . Anemia   . Depression   . History of seizures   . Narcolepsy   . Sleep paralysis   . UTI (urinary tract infection)     Patient Active Problem List   Diagnosis Date Noted  . VAGINITIS, CANDIDAL 04/08/2010  . URINALYSIS, ABNORMAL 04/05/2010  . 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 PERSISTENT DISORDER INIT/MAINTAINING WAKEFULNESS 03/31/2008  . FATIGUE 03/31/2008    Past Surgical History:  Procedure Laterality Date  . CESAREAN SECTION    . WISDOM TOOTH EXTRACTION       OB History   None      Home Medications    Prior to Admission medications   Medication Sig Start Date End Date Taking? Authorizing Provider  aspirin-acetaminophen-caffeine (EXCEDRIN MIGRAINE) [phone] MG tablet Take 1 tablet by mouth every 6 (six) hours as needed for headache.    [provider]  azithromycin (ZITHROMAX) 250 MG tablet Take 1 tablet (250 mg total) by mouth daily. Take first 2 tablets together, then 1 every day until finished. 07/15/18   Wieters, Hallie C, PA-C  naproxen (NAPROSYN) 500 MG tablet Take 1 tablet (500 mg total) by mouth 2 (two) times daily. 07/15/18   Wieters, Elesa Hacker, PA-C    Family History Family History  Problem Relation Age of Onset  . Hypertension Mother   . Diabetes  Mother   . Alcohol abuse Father   . Heart disease Father   . Hyperlipidemia Father     Social History Social History   Tobacco Use  . Smoking status: Never Smoker  . Smokeless tobacco: Never Used  Substance Use Topics  . Alcohol use: No  . Drug use: No     Allergies   Patient has no known allergies.   Review of Systems Review of Systems  Neurological: Positive for syncope and headaches.  All other systems reviewed and are negative.    Physical Exam Updated Vital Signs BP 132/76 (BP Location: Right Arm)   Pulse 66   Temp 98.2 F (36.8 C)   Resp 14   LMP 07/05/2018   SpO2 100%   Physical Exam  Constitutional: She is oriented to person, place, and time. She appears well-developed and well-nourished. No distress.  HENT:  Head: Normocephalic and atraumatic.  Right Ear: Hearing normal.  Left Ear: Hearing normal.  Nose: Nose normal.  Mouth/Throat: Oropharynx is clear and moist and mucous membranes are normal.  Eyes: Pupils are equal, round, and reactive to light. Conjunctivae and EOM are normal.  Neck: Normal range of motion. Neck supple.  Cardiovascular: Regular rhythm, S1 normal and S2 normal. Exam reveals no gallop and no friction rub.  No murmur heard. Pulmonary/Chest: Effort normal and breath sounds normal. No respiratory distress. She exhibits no tenderness.  Abdominal: Soft. Normal appearance and bowel sounds are normal. There is no hepatosplenomegaly. There is no tenderness. There is no rebound, no guarding, no tenderness at McBurney's point and negative Murphy's sign. No hernia.  Musculoskeletal: Normal range of motion.  Neurological: She is alert and oriented to person, place, and time. She has normal strength. No cranial nerve deficit or sensory deficit. Coordination normal. GCS eye subscore is 4. GCS verbal subscore is 5. GCS motor subscore is 6.  Skin: Skin is warm, dry and intact. No rash noted. No cyanosis.  Psychiatric: She has a normal mood and  affect. Her speech is normal and behavior is normal. Thought content normal.  Nursing note and vitals reviewed.    ED Treatments / Results  Labs (all labs ordered are listed, but only abnormal results are displayed) Labs Reviewed  URINALYSIS, ROUTINE W REFLEX MICROSCOPIC - Abnormal; Notable for the following components:      Result Value   Color, Urine STRAW (*)    Hgb urine dipstick SMALL (*)    All other components within normal limits  HCG, SERUM, QUALITATIVE - Abnormal; Notable for the following components:   Preg, Serum POSITIVE (*)    All other components within normal limits  I-STAT BETA HCG BLOOD, ED ([facility], [facility], [facility] ONLY) - Abnormal; Notable for the following components:   I-stat hCG, quantitative 10.1 (*)    All other components within normal limits  BASIC METABOLIC PANEL  CBC  CBG MONITORING, ED    EKG EKG Interpretation  Date/Time:  Monday July 29 2018 23:43:21 EST Ventricular Rate:  69 PR Interval:  180 QRS Duration: 80 QT Interval:  384 QTC Calculation: 411 R Axis:   50 Text Interpretation:  Normal sinus rhythm Normal ECG Confirmed by Orpah Greek [id number]) on 07/30/2018 2:34:08 AM   Radiology Ct Head Wo Contrast  Result Date: 07/30/2018 CLINICAL DATA:  Acute onset of dizziness and worsening headache. EXAM: CT HEAD WITHOUT CONTRAST TECHNIQUE: Contiguous axial images were obtained from the base of the skull through the vertex without intravenous contrast. COMPARISON:  CT of the head performed 04/20/2017 FINDINGS: Brain: No evidence of acute infarction, hemorrhage, hydrocephalus, extra-axial collection or mass lesion/mass effect. The posterior fossa, including the cerebellum, brainstem and fourth ventricle, is within normal limits. The third and lateral ventricles, and basal ganglia are unremarkable in appearance. The cerebral hemispheres are symmetric in appearance, with normal gray-white differentiation. No mass effect or midline shift is seen. Vascular:  No hyperdense vessel or unexpected calcification. Skull: There is no evidence of fracture; visualized osseous structures are unremarkable in appearance. Sinuses/Orbits: The visualized portions of the orbits are within normal limits. The paranasal sinuses and mastoid air cells are well-aerated. Other: No significant soft tissue abnormalities are seen. IMPRESSION: Unremarkable noncontrast CT of the head. Electronically Signed   By: Garald Balding M.D.   On: 07/30/2018 03:46    Procedures Procedures (including critical care time)  Medications Ordered in ED Medications  sodium chloride 0.9 % bolus 1,000 mL (0 mLs Intravenous Stopped 07/30/18 0359)  ketorolac (TORADOL) 30 MG/ML injection 30 mg (30 mg Intravenous Given 07/30/18 0309)  prochlorperazine (COMPAZINE) injection 10 mg (10 mg Intravenous Given 07/30/18 0311)  dexamethasone (DECADRON) injection 10 mg (10 mg Intravenous Given 07/30/18 0311)  diphenhydrAMINE (BENADRYL) injection 25 mg (25 mg Intravenous Given 07/30/18 0311)  valproate (DEPACON) 500 mg in dextrose 5 % 50 mL IVPB (0 mg Intravenous Stopped 07/30/18 0359)     Initial Impression / Assessment and Plan /  ED Course  I have reviewed the triage vital signs and the nursing notes.  Pertinent labs & imaging results that were available during my care of the patient were reviewed by me and considered in my medical decision making (see chart for details).     Patient presents to the ER for evaluation of headache.  She has a history of frequent recurrent migraines.  This headache is similar to previous migraines.  She did have a syncopal episode.  She reports progressively worsening headache since that time.  She therefore underwent head CT which was negative.  Patient does not have any neurologic deficit.  She was treated with migraine medications and has had resolution of her headache, appears well at this time.  She is appropriate for discharge, follow-up with her primary doctor.  The  syncope appears to be a recurrent problem.  She does have a history of seizure, however, family witnessed the episode and she did not have any seizure activity.  Final Clinical Impressions(s) / ED Diagnoses   Final diagnoses:  Other migraine without status migrainosus, not intractable  Syncope, unspecified syncope type    ED Discharge Orders    None       Orpah Greek, MD 07/30/18 [phone]

## 2018-10-13 ENCOUNTER — Other Ambulatory Visit: Payer: Self-pay

## 2018-10-13 ENCOUNTER — Encounter (HOSPITAL_COMMUNITY): Payer: Self-pay

## 2018-10-13 DIAGNOSIS — L42 Pityriasis rosea: Secondary | ICD-10-CM | POA: Insufficient documentation

## 2018-10-13 NOTE — ED Triage Notes (Addendum)
Pt reports that she thinks she is allergic to the B12 vitamin gummies that she just started on the 19th. She woke up the next day with generalized body itching and some dark spots covering her back, torso and legs. She states that benadryl helped with the itching and the day that she forgot to take the gummies, she itched less. However, when she resumed the gummies, she started itching again. Denies any SOB or facial swelling.

## 2018-10-14 ENCOUNTER — Emergency Department (HOSPITAL_COMMUNITY)
Admission: EM | Admit: 2018-10-14 | Discharge: 2018-10-14 | Disposition: A | Discharge status: Home / Self Care | Payer: Self-pay | Attending: Emergency Medicine | Admitting: Emergency Medicine

## 2018-10-14 DIAGNOSIS — L42 Pityriasis rosea: Secondary | ICD-10-CM

## 2018-10-14 MED ORDER — LORATADINE 10 MG PO TABS
10.0000 mg | ORAL_TABLET | Freq: Once | ORAL | Status: AC
  Administered 2018-10-14: 10 mg via ORAL
  Filled 2018-10-14: qty 1

## 2018-10-14 MED ORDER — HYDROXYZINE HCL 25 MG PO TABS
25.0000 mg | ORAL_TABLET | Freq: Once | ORAL | Status: AC
  Administered 2018-10-14: 25 mg via ORAL
  Filled 2018-10-14: qty 1

## 2018-10-14 NOTE — Discharge Instructions (Signed)
We recommend use of daily Zyrtec or Claritin for management of itching.  You may supplement this with Benadryl or topical hydrocortisone cream as needed.  These can be purchased over-the-counter at your local pharmacy.  Follow-up with your primary care doctor.

## 2018-10-14 NOTE — ED Provider Notes (Signed)
[facility] DEPT Provider Note   CSN: [id number] Arrival date & time: 10/13/18  2336     History   Chief Complaint Chief Complaint  Patient presents with  . Rash    HPI Jane Eaton is a 43 y.o. female.  43 year old female presents to the emergency department for evaluation of a rash.  She states that she has had symptoms for approximately 8 days.  Symptoms began with a larger spot on her posterior left shoulder.  It has since spread to her chest and back.  She complains of itching throughout her body.  Benadryl has helped the itching some.  She believes that she may be experiencing a reaction to B12 vitamin Gummies.  She has not had any difficulty breathing, difficulty swallowing.  No recent travel or hotel room stays.  No other individuals in her home are experiencing similar symptoms.  The history is provided by the patient. No language interpreter was used.  Rash    Past Medical History:  Diagnosis Date  . Allergy   . Anemia   . Depression   . History of seizures   . Narcolepsy   . Sleep paralysis   . UTI (urinary tract infection)     Patient Active Problem List   Diagnosis Date Noted  . VAGINITIS, CANDIDAL 04/08/2010  . URINALYSIS, ABNORMAL 04/05/2010  . 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 PERSISTENT DISORDER INIT/MAINTAINING WAKEFULNESS 03/31/2008  . FATIGUE 03/31/2008    Past Surgical History:  Procedure Laterality Date  . CESAREAN SECTION    . WISDOM TOOTH EXTRACTION       OB History   No obstetric history on file.      Home Medications    Prior to Admission medications   Medication Sig Start Date End Date Taking? Authorizing Provider  aspirin-acetaminophen-caffeine (EXCEDRIN MIGRAINE) [phone] MG tablet Take 1 tablet by mouth every 6 (six) hours as needed for headache.    [provider]  azithromycin (ZITHROMAX) 250 MG tablet Take 1 tablet (250 mg total) by mouth daily. Take  first 2 tablets together, then 1 every day until finished. 07/15/18   Wieters, Hallie C, PA-C  naproxen (NAPROSYN) 500 MG tablet Take 1 tablet (500 mg total) by mouth 2 (two) times daily. 07/15/18   Wieters, Elesa Hacker, PA-C    Family History Family History  Problem Relation Age of Onset  . Hypertension Mother   . Diabetes Mother   . Alcohol abuse Father   . Heart disease Father   . Hyperlipidemia Father     Social History Social History   Tobacco Use  . Smoking status: Never Smoker  . Smokeless tobacco: Never Used  Substance Use Topics  . Alcohol use: No  . Drug use: No     Allergies   Patient has no known allergies.   Review of Systems Review of Systems  Skin: Positive for rash.   Ten systems reviewed and are negative for acute change, except as noted in the HPI.    Physical Exam Updated Vital Signs BP (!) 153/92 (BP Location: Left Arm)   Pulse 84   Temp 97.8 F (36.6 C) (Oral)   Resp 16   Ht 5\' 1"  (1.549 m)   Wt 67.1 kg   SpO2 99%   BMI 27.96 kg/m   Physical Exam Vitals signs and nursing note reviewed.  Constitutional:      General: She is not in acute distress.    Appearance: She is well-developed.  She is not diaphoretic.     Comments: Scratching body. In NAD.  HENT:     Head: Normocephalic and atraumatic.     Mouth/Throat:     Comments: No angioedema. Tolerating secretions. Eyes:     General: No scleral icterus.    Conjunctiva/sclera: Conjunctivae normal.  Neck:     Musculoskeletal: Normal range of motion.  Cardiovascular:     Rate and Rhythm: Normal rate and regular rhythm.     Pulses: Normal pulses.  Pulmonary:     Effort: Pulmonary effort is normal. No respiratory distress.     Comments: Respirations even and unlabored Musculoskeletal: Normal range of motion.  Skin:    General: Skin is warm and dry.     Coloration: Skin is not pale.     Findings: Rash present. No erythema.     Comments: Planar, macule to posterior left shoulder c/w  herald patch. Dry borders. Scattered planar papular, pruritic rash to chest, abdomen, and back. Also noted to proximal BUE.  Neurological:     Mental Status: She is alert and oriented to person, place, and time.  Psychiatric:        Behavior: Behavior normal.      ED Treatments / Results  Labs (all labs ordered are listed, but only abnormal results are displayed) Labs Reviewed - No data to display  EKG None  Radiology No results found.  Procedures Procedures (including critical care time)  Medications Ordered in ED Medications  hydrOXYzine (ATARAX/VISTARIL) tablet 25 mg (25 mg Oral Given 10/14/18 0323)  loratadine (CLARITIN) tablet 10 mg (10 mg Oral Given 10/14/18 0323)     Initial Impression / Assessment and Plan / ED Course  I have reviewed the triage vital signs and the nursing notes.  Pertinent labs & imaging results that were available during my care of the patient were reviewed by me and considered in my medical decision making (see chart for details).     43 year old female presenting for evaluation of a rash which is consistent with pityriasis rosea.  I have discussed self-limiting nature of symptoms as well as supportive care.  Treated in the ED with Claritin and Atarax.  Return precautions discussed and provided. Patient discharged in stable condition with no unaddressed concerns.   Final Clinical Impressions(s) / ED Diagnoses   Final diagnoses:  Pityriasis rosea    ED Discharge Orders    None       Antonietta Breach, PA-C 10/14/18 0331    Ezequiel Essex, MD 10/14/18 [phone]

## 2018-11-24 ENCOUNTER — Ambulatory Visit (HOSPITAL_COMMUNITY)
Admission: EM | Admit: 2018-11-24 | Discharge: 2018-11-24 | Disposition: A | Discharge status: Home / Self Care | Payer: Self-pay | Attending: Family Medicine | Admitting: Family Medicine

## 2018-11-24 ENCOUNTER — Encounter (HOSPITAL_COMMUNITY): Payer: Self-pay | Admitting: *Deleted

## 2018-11-24 DIAGNOSIS — R51 Headache: Secondary | ICD-10-CM | POA: Insufficient documentation

## 2018-11-24 DIAGNOSIS — R519 Headache, unspecified: Secondary | ICD-10-CM

## 2018-11-24 DIAGNOSIS — R110 Nausea: Secondary | ICD-10-CM

## 2018-11-24 DIAGNOSIS — R55 Syncope and collapse: Secondary | ICD-10-CM | POA: Insufficient documentation

## 2018-11-24 DIAGNOSIS — R000 Tachycardia, unspecified: Secondary | ICD-10-CM

## 2018-11-24 LAB — CBC WITH DIFFERENTIAL/PLATELET
Abs Immature Granulocytes: 0.01 10*3/uL (ref 0.00–0.07)
BASOS ABS: 0 10*3/uL (ref 0.0–0.1)
Basophils Relative: 0 %
EOS PCT: 1 %
Eosinophils Absolute: 0.1 10*3/uL (ref 0.0–0.5)
HEMATOCRIT: 43 % (ref 36.0–46.0)
HEMOGLOBIN: 13.4 g/dL (ref 12.0–15.0)
Immature Granulocytes: 0 %
LYMPHS ABS: 2 10*3/uL (ref 0.7–4.0)
Lymphocytes Relative: 42 %
MCH: 26.1 pg (ref 26.0–34.0)
MCHC: 31.2 g/dL (ref 30.0–36.0)
MCV: 83.7 fL (ref 80.0–100.0)
MONO ABS: 0.3 10*3/uL (ref 0.1–1.0)
Monocytes Relative: 5 %
NRBC: 0 % (ref 0.0–0.2)
Neutro Abs: 2.5 10*3/uL (ref 1.7–7.7)
Neutrophils Relative %: 52 %
Platelets: 312 10*3/uL (ref 150–400)
RBC: 5.14 MIL/uL — AB (ref 3.87–5.11)
RDW: 12.6 % (ref 11.5–15.5)
WBC: 4.8 10*3/uL (ref 4.0–10.5)

## 2018-11-24 LAB — COMPREHENSIVE METABOLIC PANEL
ALBUMIN: 4.1 g/dL (ref 3.5–5.0)
ALK PHOS: 76 U/L (ref 38–126)
ALT: 20 U/L (ref 0–44)
ANION GAP: 8 (ref 5–15)
AST: 24 U/L (ref 15–41)
BILIRUBIN TOTAL: 1.1 mg/dL (ref 0.3–1.2)
BUN: 13 mg/dL (ref 6–20)
CALCIUM: 9.3 mg/dL (ref 8.9–10.3)
CO2: 28 mmol/L (ref 22–32)
Chloride: 104 mmol/L (ref 98–111)
Creatinine, Ser: 1.03 mg/dL — ABNORMAL HIGH (ref 0.44–1.00)
GFR calc non Af Amer: >60 mL/min (ref >60)
GLUCOSE: 95 mg/dL (ref 70–99)
Potassium: 3.8 mmol/L (ref 3.5–5.1)
Sodium: 140 mmol/L (ref 135–145)
TOTAL PROTEIN: 7 g/dL (ref 6.5–8.1)

## 2018-11-24 MED ORDER — KETOROLAC TROMETHAMINE 60 MG/2ML IM SOLN
60.0000 mg | Freq: Once | INTRAMUSCULAR | Status: AC
  Administered 2018-11-24: 60 mg via INTRAMUSCULAR

## 2018-11-24 MED ORDER — KETOROLAC TROMETHAMINE 60 MG/2ML IM SOLN
INTRAMUSCULAR | Status: AC
  Filled 2018-11-24: qty 2

## 2018-11-24 NOTE — Discharge Instructions (Signed)
I will call you if there are any concerning findings with your blood tests.  Your ekg looks well today.  Please increase fluids, snacking and protein intake while at work as it sounds likely that your blood sugar was low yesterday.  If you develop recurrent symptoms, chest pain , shortness of breath , dizziness, loss of consciousness or otherwise worsening please go to the ER.  Please establish with a primary care provider for recheck and management.

## 2018-11-24 NOTE — ED Provider Notes (Signed)
[facility]    CSN: [id number] Arrival date & time: 11/24/18  1101     History   Chief Complaint Chief Complaint  Patient presents with  . Tachycardia    HPI Jane Eaton is a 43 y.o. female.   Kyomi presents with complaints of episode of near syncope yesterday while at work. 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 Episode was just prior to lunch, she had eaten a yogurt a few hours prior to episode. She started feeling her hands tingle, felt shaky and hot. She went to the restroom and felt her vision go grey colored. Did not lose consciousness or fall. States she looked at her watch which tracks [place] and noticed it was 111. States that is more typical of her HR during exercises. Typically while working she is in 80's or 90's. She states she had some sensation of chest tightness but no chest pain . No diaphoresis. Mild nausea at the time but no vomiting. No arm or jaw pain. The symptoms then resolved. Today she has a mild headache. She states she has had regular headaches ever since she had a seizure in November. Headache today feels the same as previous headaches. She hasn't taken any medications for headache. No chest pain , no palpitations, no shortness of breath. No fevers or cough. No leg pain or swelling. No recent travel. Doesn't smoke. Not on any birth control or hormone therapy, no recent hospitalizations. No dizziness. States she has had similar episodes in the past, has even passed out, and was found to be hypoglycemic. Doesn't follow regularly with a PCP. No known cardiac history. Parents both with cardiac history. Hx of anemia, depression, seizures, narcolepsy.    ROS per HPI, negative if not otherwise mentioned.      Past Medical History:  Diagnosis Date  . Allergy   . Anemia   . Depression   . History of seizures   . Narcolepsy   . Sleep paralysis   .  UTI (urinary tract infection)     Patient Active Problem List   Diagnosis Date Noted  . VAGINITIS, CANDIDAL 04/08/2010  . URINALYSIS, ABNORMAL 04/05/2010  . 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 PERSISTENT DISORDER INIT/MAINTAINING WAKEFULNESS 03/31/2008  . FATIGUE 03/31/2008    Past Surgical History:  Procedure Laterality Date  . CESAREAN SECTION    . WISDOM TOOTH EXTRACTION      OB History   No obstetric history on file.      Home Medications    Prior to Admission medications   Medication Sig Start Date End Date Taking? Authorizing Provider  aspirin-acetaminophen-caffeine (EXCEDRIN MIGRAINE) [phone] MG tablet Take 1 tablet by mouth every 6 (six) hours as needed for headache.    [provider]  azithromycin (ZITHROMAX) 250 MG tablet Take 1 tablet (250 mg total) by mouth daily. Take first 2 tablets together, then 1 every day until finished. 07/15/18   Wieters, Hallie C, PA-C  naproxen (NAPROSYN) 500 MG tablet Take 1 tablet (500 mg total) by mouth 2 (two) times daily. 07/15/18   Wieters, Elesa Hacker, PA-C    Family History Family History  Problem Relation Age of Onset  . Hypertension Mother   . Diabetes Mother   . Alcohol abuse Father   . Heart disease Father   . Hyperlipidemia Father     Social History Social History  Tobacco Use  . Smoking status: Never Smoker  . Smokeless tobacco: Never Used  Substance Use Topics  . Alcohol use: No  . Drug use: No     Allergies   Patient has no known allergies.   Review of Systems Review of Systems   Physical Exam Triage Vital Signs ED Triage Vitals [11/24/18 1155]  Enc Vitals Group     BP 126/70     Pulse Rate 69     Resp 16     Temp 98.1 F (36.7 C)     Temp Source Oral     SpO2 100 %     Weight      Height      Head Circumference      Peak Flow      Pain Score 2     Pain Loc      Pain Edu?      Excl. in [place]?    No data found.  Updated Vital Signs BP 126/70    Pulse 69   Temp 98.1 F (36.7 C) (Oral)   Resp 16   LMP 11/13/2018 (Approximate)   SpO2 100%   Visual Acuity Right Eye Distance:   Left Eye Distance:   Bilateral Distance:    Right Eye Near:   Left Eye Near:    Bilateral Near:     Physical Exam Constitutional:      General: She is not in acute distress.    Appearance: She is well-developed.  HENT:     Head: Normocephalic and atraumatic.  Cardiovascular:     Rate and Rhythm: Normal rate and regular rhythm.     Pulses: Normal pulses.     Heart sounds: Normal heart sounds.  Pulmonary:     Effort: Pulmonary effort is normal.     Breath sounds: Normal breath sounds.  Skin:    General: Skin is warm and dry.  Neurological:     General: No focal deficit present.     Mental Status: She is alert and oriented to person, place, and time.     Cranial Nerves: Cranial nerves are intact.     Sensory: Sensation is intact.     Motor: Motor function is intact.     Coordination: Coordination is intact.     Gait: Gait is intact.    EKG: NSR. Previous EKG was available for review without changes. No stwave changes as interpreted by me.    UC Treatments / Results  Labs (all labs ordered are listed, but only abnormal results are displayed) Labs Reviewed  CBC WITH DIFFERENTIAL/PLATELET  COMPREHENSIVE METABOLIC PANEL    EKG None  Radiology No results found.  Procedures Procedures (including critical care time)  Medications Ordered in UC Medications  ketorolac (TORADOL) injection 60 mg (has no administration in time range)    Initial Impression / Assessment and Plan / UC Course  I have reviewed the triage vital signs and the nursing notes.  Pertinent labs & imaging results that were available during my care of the patient were reviewed by me and considered in my medical decision making (see chart for details).     No acute findings today. ekg without acute changes. Near syncope incident yesterday, sounds most consistent  with episode of hypoglycemia. CBC and CMP pending, will notify patient of any concerning findings. Encouraged increased intake while at work. Encouraged follow up with PCP as may need further evaluation if symptoms recur. toradol for headache provided. Return precautions provided. Patient verbalized understanding  and agreeable to plan.    Final Clinical Impressions(s) / UC Diagnoses   Final diagnoses:  Near syncope  Nonintractable headache, unspecified chronicity pattern, unspecified headache type     Discharge Instructions     I will call you if there are any concerning findings with your blood tests.  Your ekg looks well today.  Please increase fluids, snacking and protein intake while at work as it sounds likely that your blood sugar was low yesterday.  If you develop recurrent symptoms, chest pain , shortness of breath , dizziness, loss of consciousness or otherwise worsening please go to the ER.  Please establish with a primary care provider for recheck and management.    ED Prescriptions    None     Controlled Substance Prescriptions [place] Controlled Substance Registry consulted? Not Applicable   Zigmund Gottron, NP 11/24/18 1240

## 2018-11-24 NOTE — ED Triage Notes (Signed)
Reports having episode of near-syncope and elevated HR in 110s yesterday while at work, which resolved yesterday.  Today states only c/o slight HA.  States needs clearance to return to work. Discussed with Rondel Oh, NP.

## 2018-11-25 ENCOUNTER — Telehealth (HOSPITAL_COMMUNITY): Payer: Self-pay | Admitting: Emergency Medicine

## 2018-11-25 NOTE — Telephone Encounter (Signed)
Attempted to reach patient about lab results. No answer at this time.

## 2018-11-29 IMAGING — DX DG CHEST 2V
2 series · 2 of 2 positions shown · non-contrast
Comparison: None.

CLINICAL DATA: Cough and shortness of breath for 1 week.

EXAM:
CHEST - 2 VIEW

[chest pa]
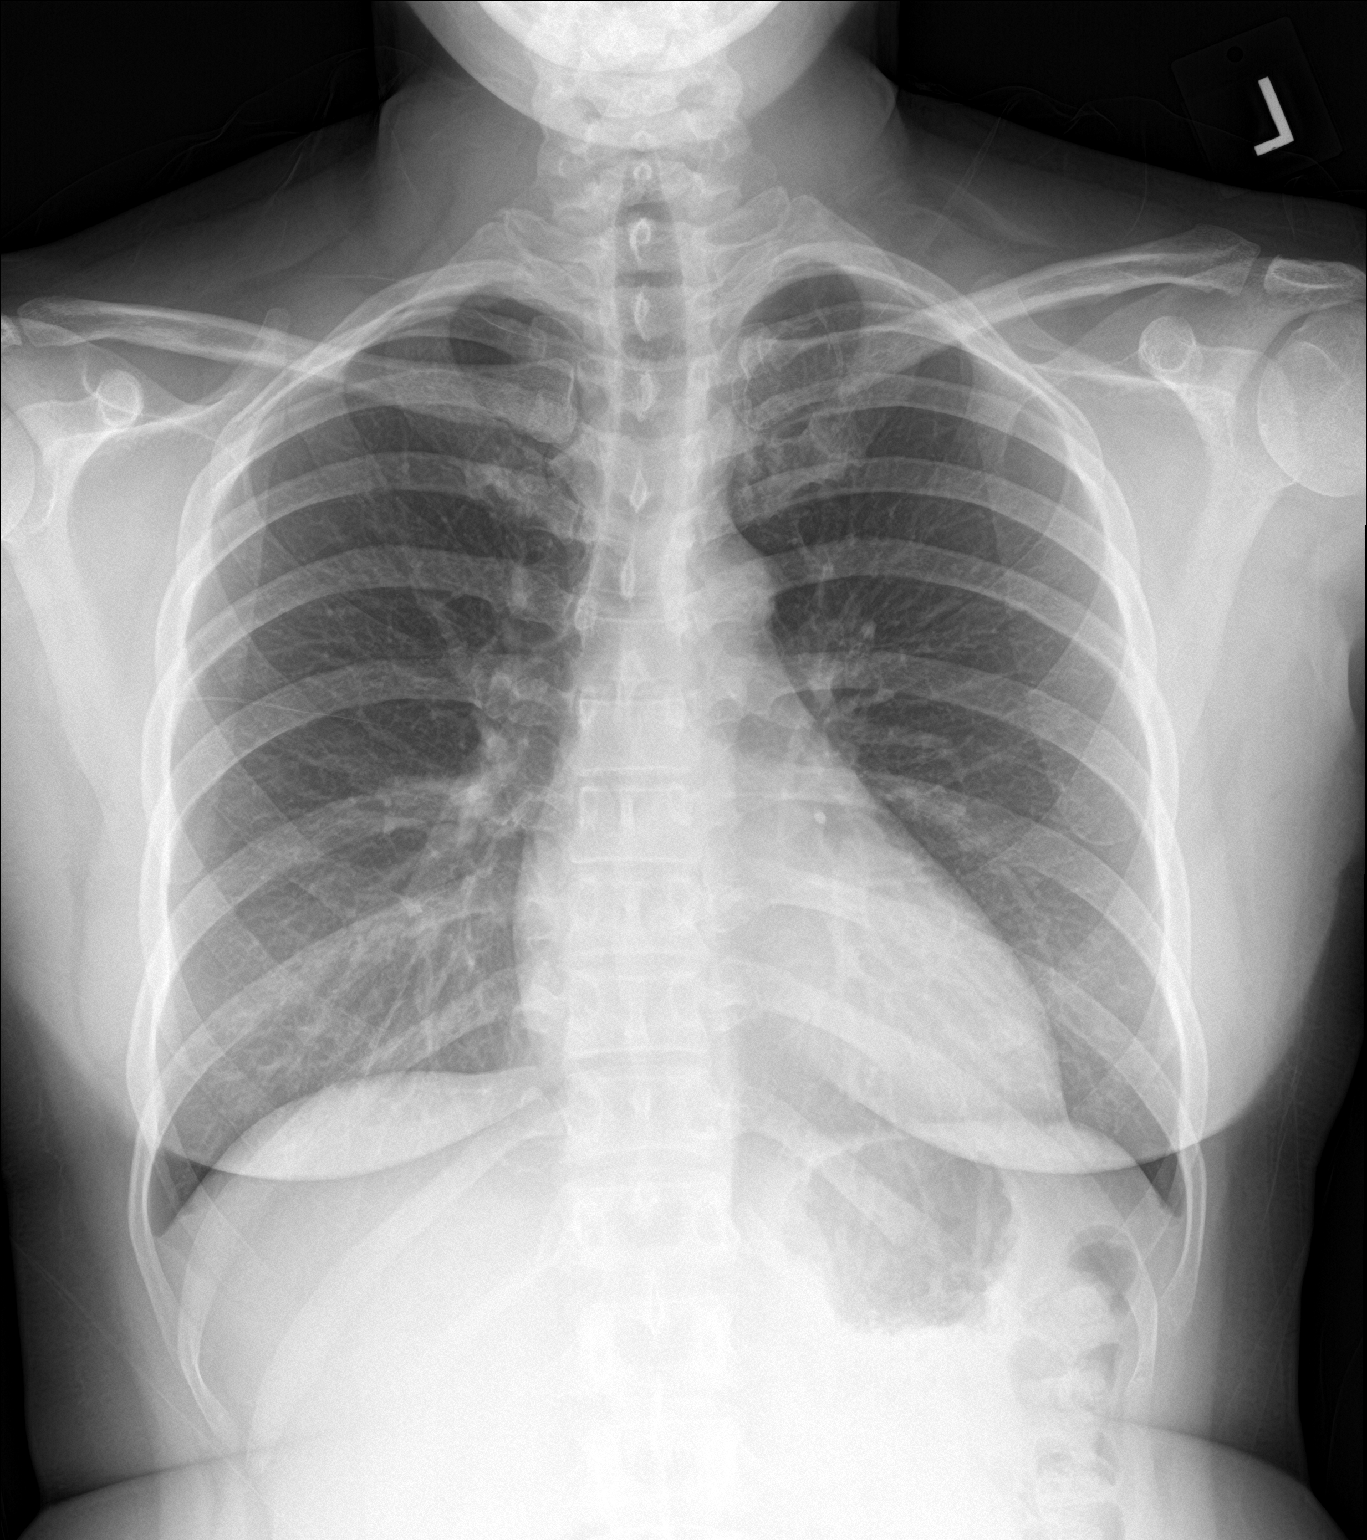

[chest lat]
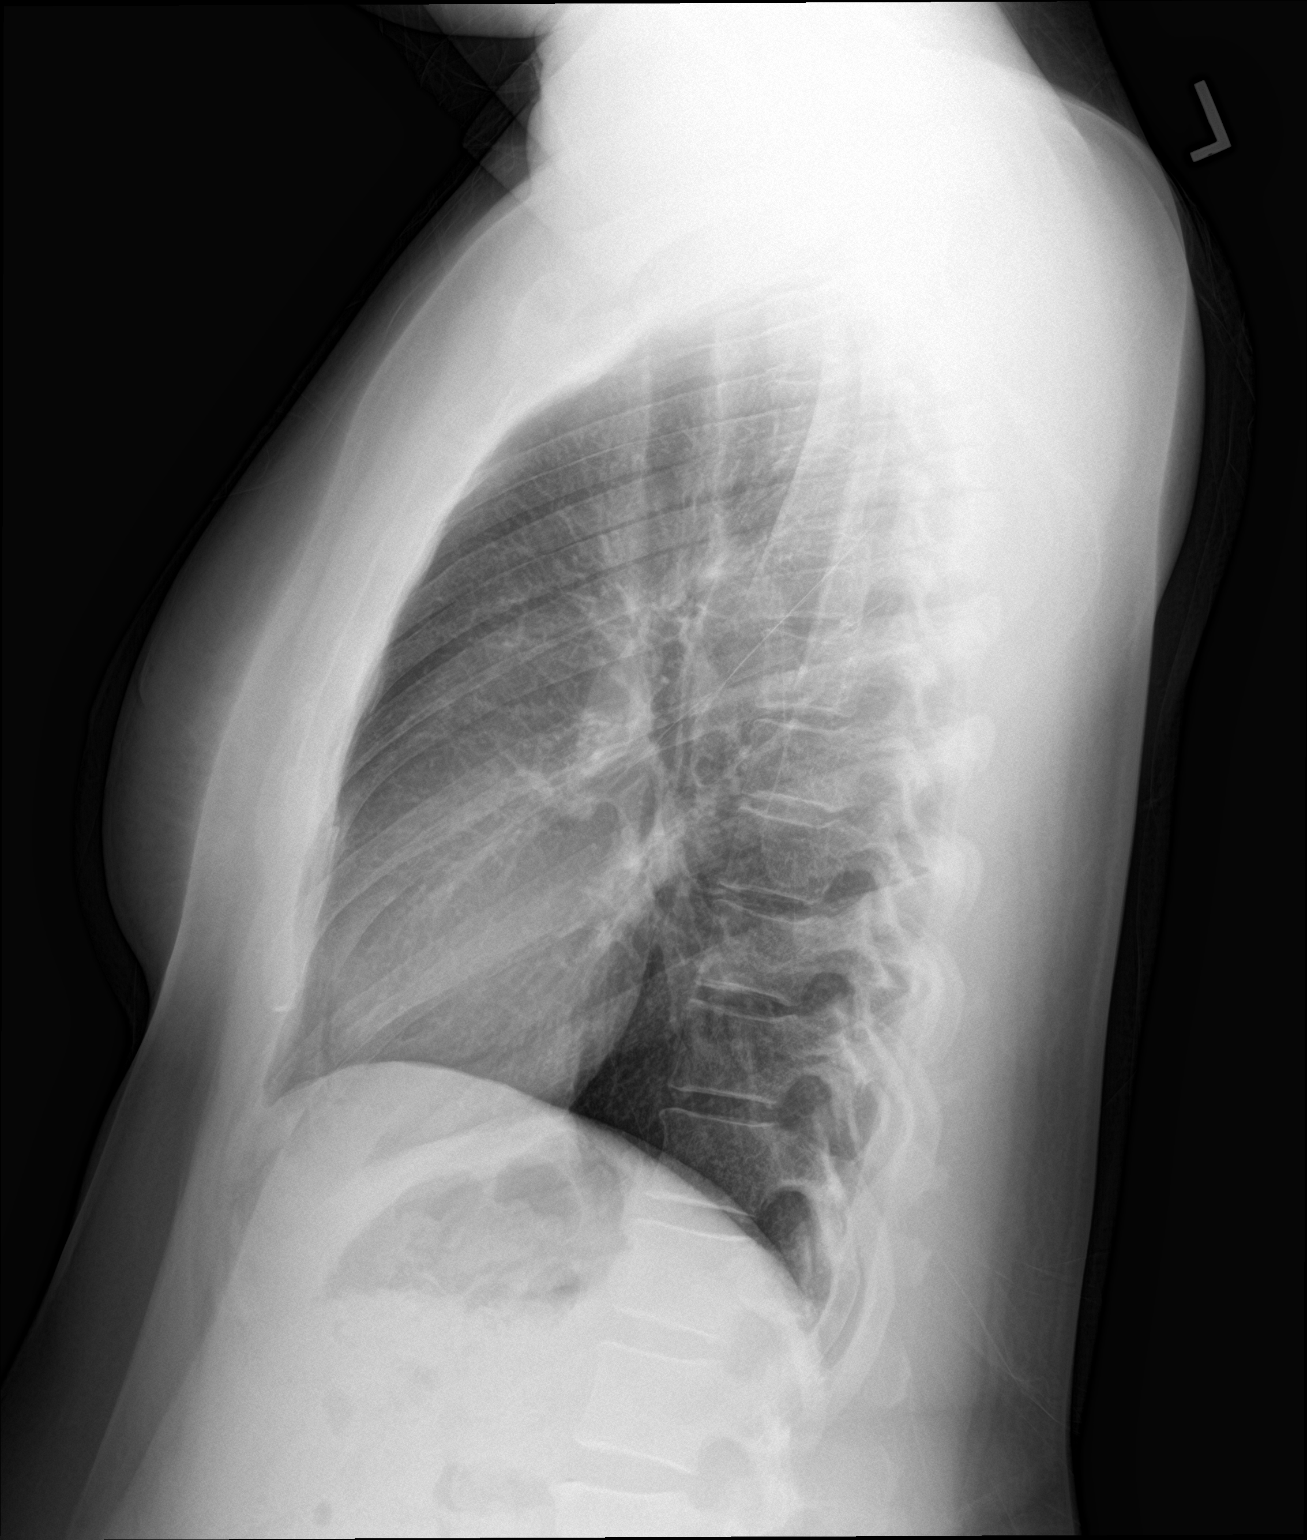

[2 of 2 positions shown; findings below may reference images not displayed]

FINDINGS: The cardiomediastinal silhouette is unremarkable.

There is no evidence of focal airspace disease, pulmonary edema,
suspicious pulmonary nodule/mass, pleural effusion, or pneumothorax.

No acute bony abnormalities are identified.
IMPRESSION: No active cardiopulmonary disease.

## 2018-12-14 IMAGING — CT CT HEAD W/O CM
4 series · 17 of 47 positions shown, 19 images · non-contrast
Comparison: CT of the head performed 04/20/2017

CLINICAL DATA: Acute onset of dizziness and worsening headache.

EXAM:
CT HEAD WITHOUT CONTRAST
TECHNIQUE: Contiguous axial images were obtained from the base of the skull
through the vertex without intravenous contrast.

[Series 3: head wo · axial · 0.39mm/px · z∈[-123,-3]mm · 7 of 34 slices shown, 9 images]
[im 5/34  brain]
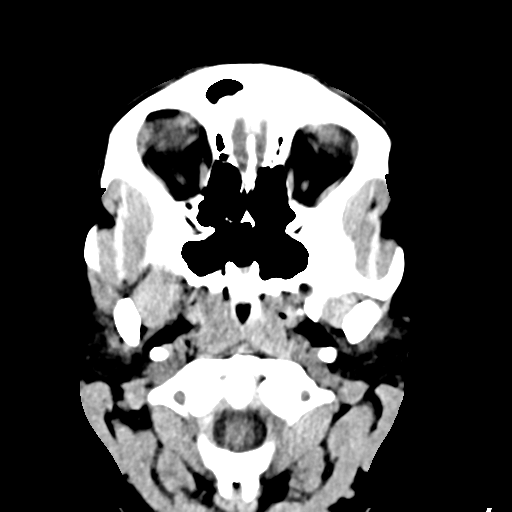
[im 5/34  bone]
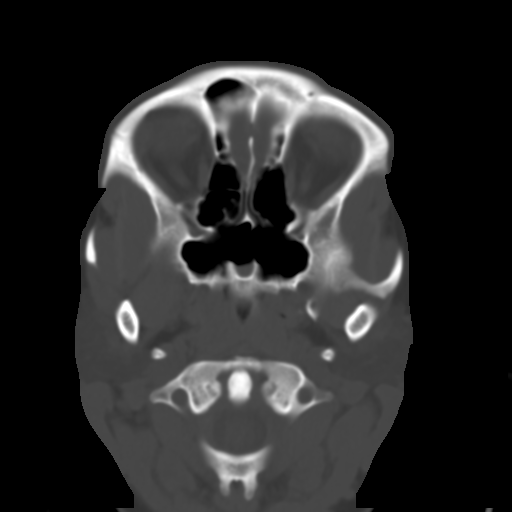
[im 9/34  brain]
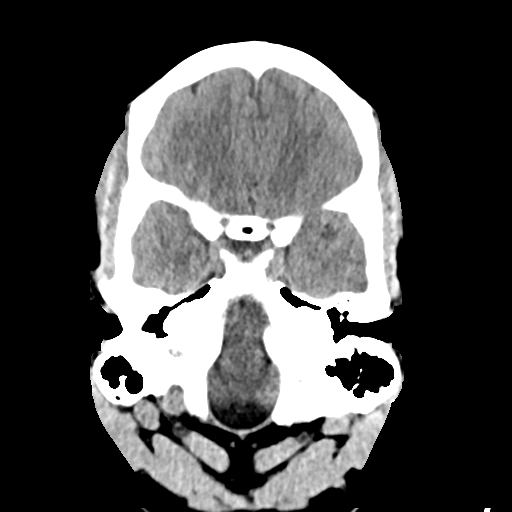
[im 13/34  brain]
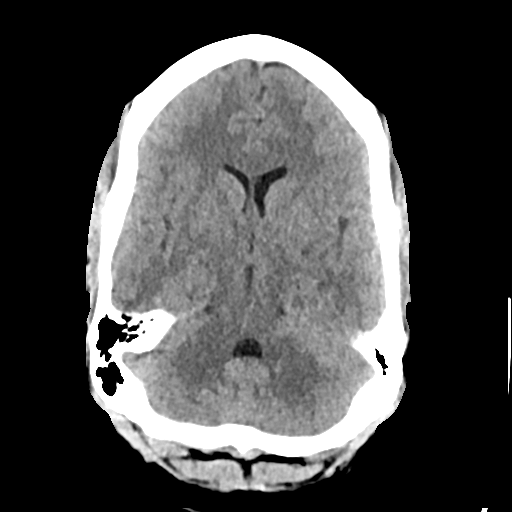
[im 17/34  brain]
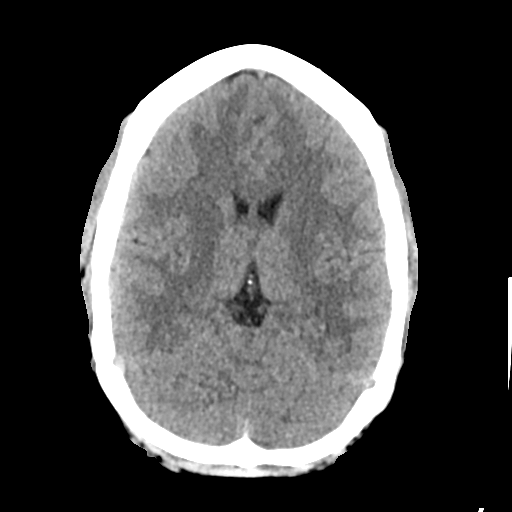
[im 21/34  brain]
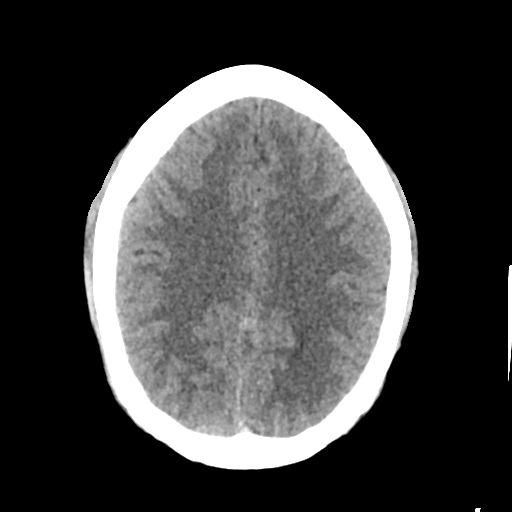
[im 21/34  bone]
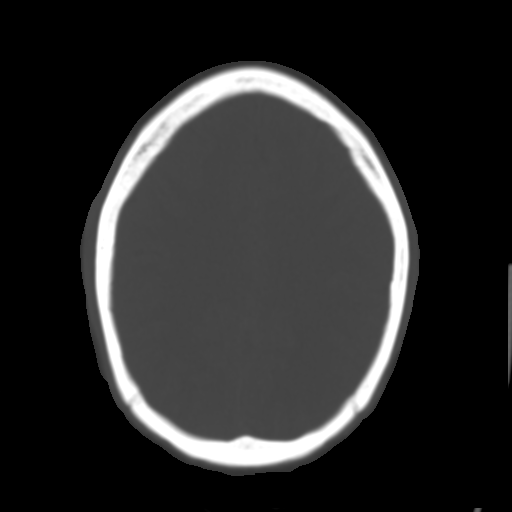
[im 25/34  brain]
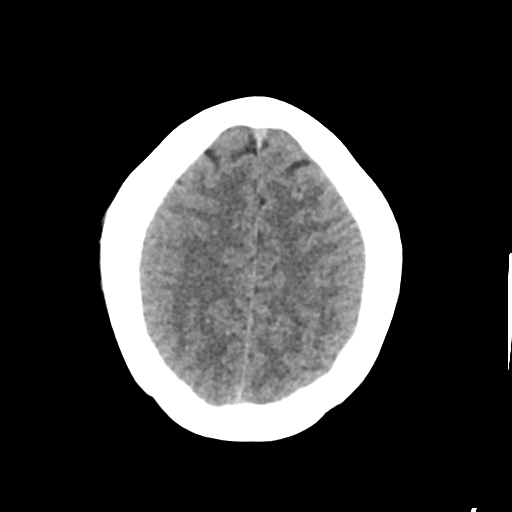
[im 29/34  brain]
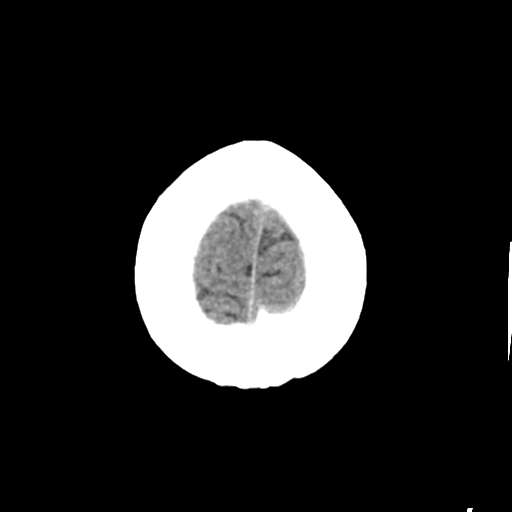

[Series 4: head bone · axial · 0.39mm/px · z∈[-127,-69]mm · 4 of 85 slices shown]
[im 9/85  bone]
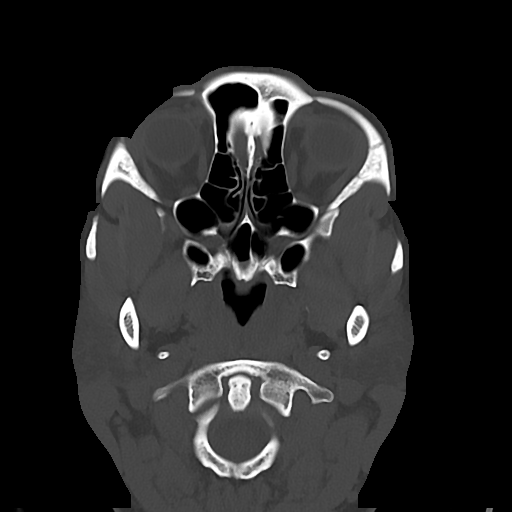
[im 17/85  bone]
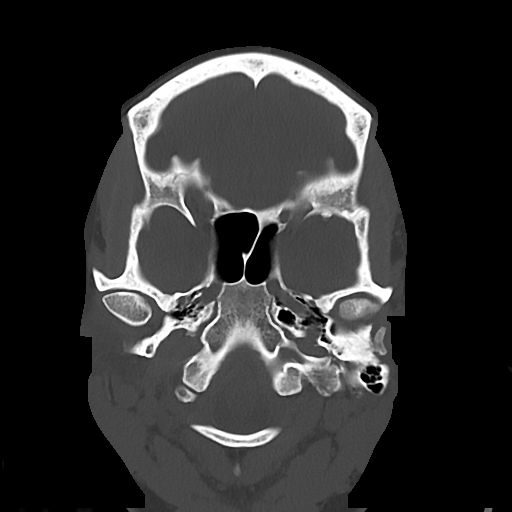
[im 26/85  bone]
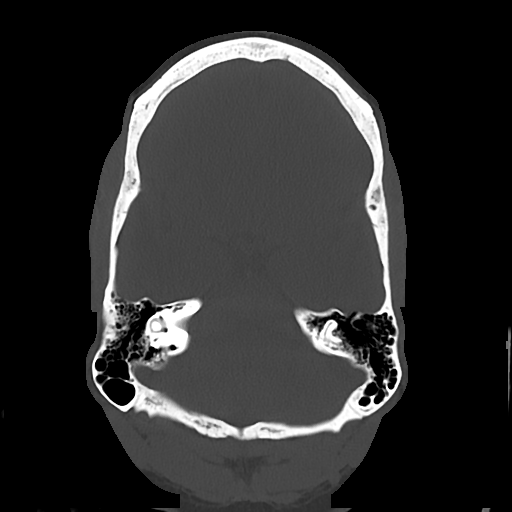
[im 38/85  bone]
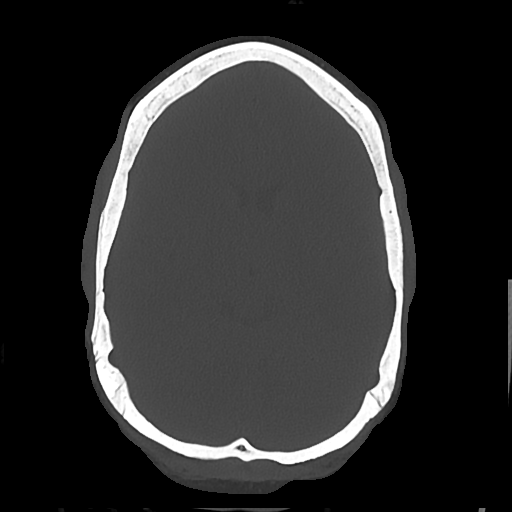

[Series 5: cor soft · coronal · 0.30mm/px · 3 of 67 slices shown]
[im 23/67  brain]
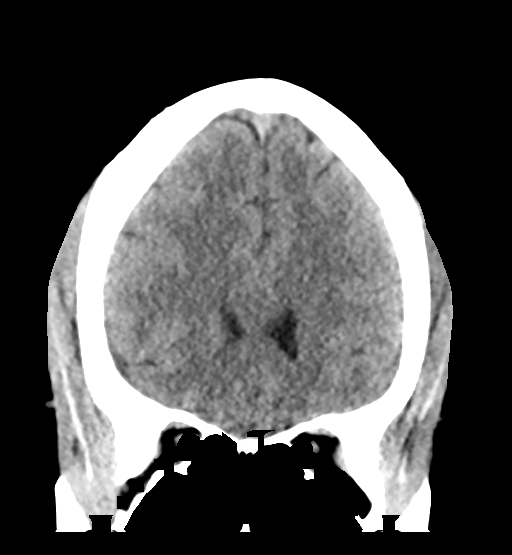
[im 30/67  brain]
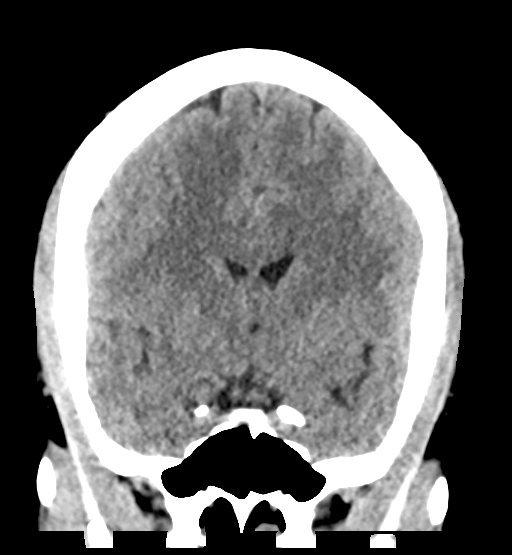
[im 37/67  brain]
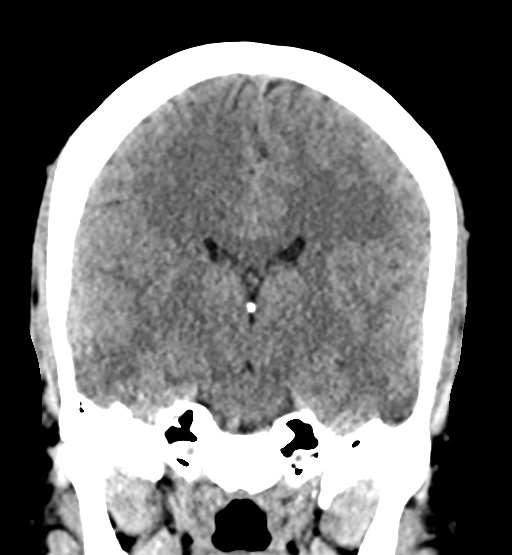

[Series 6: sag soft · sagittal · 0.33mm/px · 3 of 50 slices shown]
[im 17/50  brain]
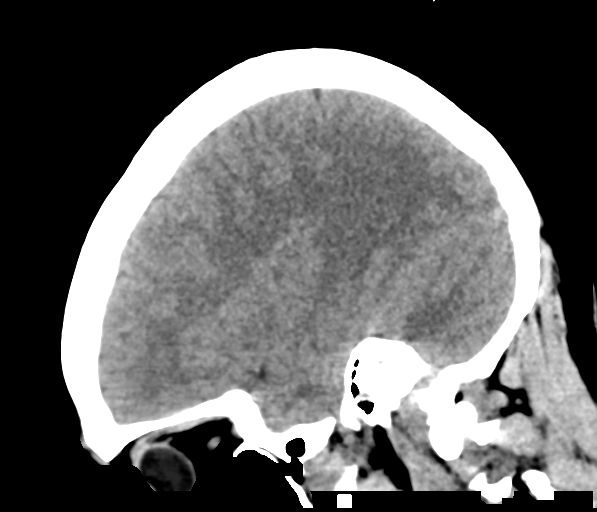
[im 25/50  brain]
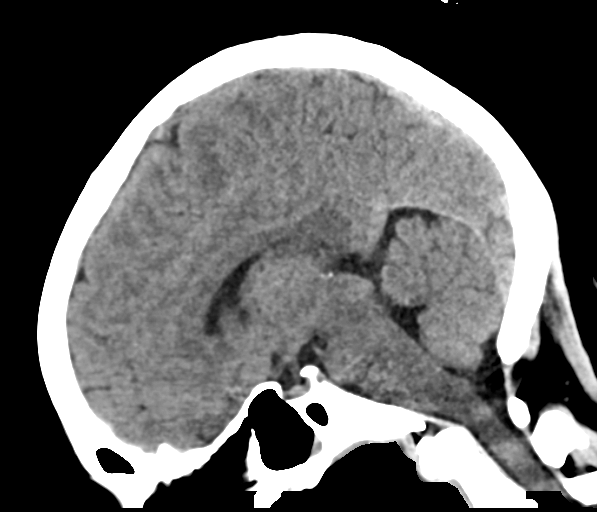
[im 33/50  brain]
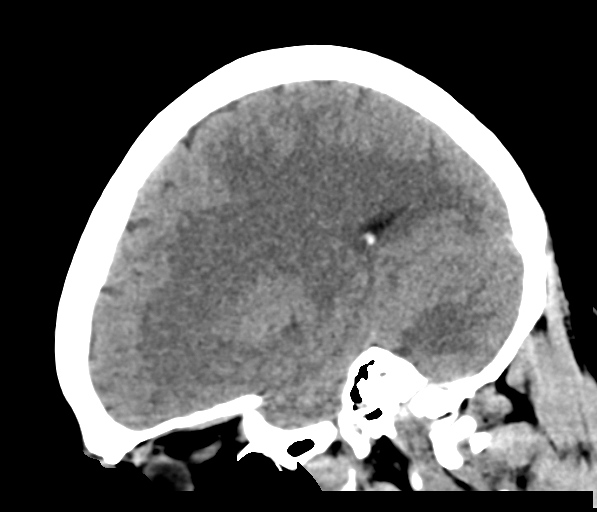

[17 of 47 positions shown; findings below may reference images not displayed]

FINDINGS: Brain: No evidence of acute infarction, hemorrhage, hydrocephalus,
extra-axial collection or mass lesion/mass effect.

The posterior fossa, including the cerebellum, brainstem and fourth
ventricle, is within normal limits. The third and lateral
ventricles, and basal ganglia are unremarkable in appearance. The
cerebral hemispheres are symmetric in appearance, with normal
gray-white differentiation. No mass effect or midline shift is seen.

Vascular: No hyperdense vessel or unexpected calcification.

Skull: There is no evidence of fracture; visualized osseous
structures are unremarkable in appearance.

Sinuses/Orbits: The visualized portions of the orbits are within
normal limits. The paranasal sinuses and mastoid air cells are
well-aerated.

Other: No significant soft tissue abnormalities are seen.
IMPRESSION: Unremarkable noncontrast CT of the head.

## 2020-04-03 ENCOUNTER — Encounter (HOSPITAL_COMMUNITY): Payer: Self-pay

## 2020-04-03 ENCOUNTER — Ambulatory Visit (HOSPITAL_COMMUNITY)
Admission: EM | Admit: 2020-04-03 | Discharge: 2020-04-03 | Disposition: A | Discharge status: Home / Self Care | Payer: 59 | Attending: Physician Assistant | Admitting: Physician Assistant

## 2020-04-03 ENCOUNTER — Other Ambulatory Visit: Payer: Self-pay

## 2020-04-03 DIAGNOSIS — M79641 Pain in right hand: Secondary | ICD-10-CM

## 2020-04-03 DIAGNOSIS — Z3202 Encounter for pregnancy test, result negative: Secondary | ICD-10-CM | POA: Diagnosis not present

## 2020-04-03 DIAGNOSIS — M654 Radial styloid tenosynovitis [de Quervain]: Secondary | ICD-10-CM | POA: Diagnosis not present

## 2020-04-03 LAB — POC URINE PREG, ED: Preg Test, Ur: NEGATIVE

## 2020-04-03 MED ORDER — DICLOFENAC SODIUM 1 % EX GEL: 4.0000 g | Freq: Four times a day (QID) | CUTANEOUS | 0 refills | Status: DC

## 2020-04-03 MED ORDER — PREDNISONE 10 MG PO TABS: 40.0000 mg | ORAL_TABLET | Freq: Every day | ORAL | 0 refills | Status: AC

## 2020-04-03 NOTE — ED Provider Notes (Signed)
[facility]    CSN: [id number] Arrival date & time: 04/03/20  1101      History   Chief Complaint Chief Complaint  Patient presents with  . Hand Pain    HPI Jane Eaton is a 44 y.o. female.   Patient reports for 3 weeks of right thumb and wrist pain.  She reports it is not bothering her much and that she is moving her thumb.  She thinks is from repetitive motions at work.  She has been wearing a brace and this helps.  She has been trying over-the-counter medicines without much relief.  Denies injury or trauma.  No fevers or chills.     Past Medical History:  Diagnosis Date  . Allergy   . Anemia   . Depression   . History of seizures   . Narcolepsy   . Sleep paralysis   . UTI (urinary tract infection)     Patient Active Problem List   Diagnosis Date Noted  . VAGINITIS, CANDIDAL 04/08/2010  . URINALYSIS, ABNORMAL 04/05/2010  . 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 PERSISTENT DISORDER INIT/MAINTAINING WAKEFULNESS 03/31/2008  . FATIGUE 03/31/2008    Past Surgical History:  Procedure Laterality Date  . CESAREAN SECTION    . WISDOM TOOTH EXTRACTION      OB History   No obstetric history on file.      Home Medications    Prior to Admission medications   Medication Sig Start Date End Date Taking? Authorizing Provider  aspirin-acetaminophen-caffeine (EXCEDRIN MIGRAINE) [phone] MG tablet Take 1 tablet by mouth every 6 (six) hours as needed for headache.    [provider]  azithromycin (ZITHROMAX) 250 MG tablet Take 1 tablet (250 mg total) by mouth daily. Take first 2 tablets together, then 1 every day until finished. 07/15/18   Wieters, Hallie C, PA-C  diclofenac Sodium (VOLTAREN) 1 % GEL Apply 4 g topically 4 (four) times daily. 04/03/20   Liberty Stead, Marguerita Beards, PA-C  naproxen (NAPROSYN) 500 MG tablet Take 1 tablet (500 mg total) by mouth 2 (two) times daily. 07/15/18   Wieters, Hallie C, PA-C  predniSONE (DELTASONE) 10  MG tablet Take 4 tablets (40 mg total) by mouth daily for 5 days. 04/03/20 04/08/20  Kadisha Goodine, Marguerita Beards, PA-C    Family History Family History  Problem Relation Age of Onset  . Hypertension Mother   . Diabetes Mother   . Alcohol abuse Father   . Heart disease Father   . Hyperlipidemia Father     Social History Social History   Tobacco Use  . Smoking status: Never Smoker  . Smokeless tobacco: Never Used  Vaping Use  . Vaping Use: Never used  Substance Use Topics  . Alcohol use: No  . Drug use: No     Allergies   Patient has no known allergies.   Review of Systems Review of Systems   Physical Exam Triage Vital Signs ED Triage Vitals [04/03/20 1211]  Enc Vitals Group     BP      Pulse      Resp      Temp      Temp src      SpO2      Weight 145 lb (65.8 kg)     Height 5\' 1"  (1.549 m)     Head Circumference      Peak Flow      Pain Score 2     Pain Loc  Pain Edu?      Excl. in [place]?    No data found.  Updated Vital Signs Ht 5\' 1"  (1.549 m)   Wt 145 lb (65.8 kg)   BMI 27.40 kg/m   Visual Acuity Right Eye Distance:   Left Eye Distance:   Bilateral Distance:    Right Eye Near:   Left Eye Near:    Bilateral Near:     Physical Exam Vitals and nursing note reviewed.  Musculoskeletal:     Comments: Right hand without deformity, swelling or erythema.  Mild tenderness to palpation the distal radius and proximal thumb.  No warmth.  Finkelstein's positive.  Negative Tinel's.  Strong pulses  Skin:    Capillary Refill: Capillary refill takes less than 2 seconds.      UC Treatments / Results  Labs (all labs ordered are listed, but only abnormal results are displayed) Labs Reviewed  POC URINE PREG, ED    EKG   Radiology No results found.  Procedures Procedures (including critical care time)  Medications Ordered in UC Medications - No data to display  Initial Impression / Assessment and Plan / UC Course  I have reviewed the triage vital  signs and the nursing notes.  Pertinent labs & imaging results that were available during my care of the patient were reviewed by me and considered in my medical decision making (see chart for details).     #De Quervain's tenosynovitis of right hand Patient is a 44 year old presenting with de Quervain's tenosynovitis of right hand.  Patient is in a thumb spica brace already, will recommend continued use of this.  Short course of prednisone with Voltaren gel.  Recommend continued ice.  Sports medicine follow-up as needed.  Discussed this with patient.  She verbalized understanding agreement the plan. Final Clinical Impressions(s) / UC Diagnoses   Final diagnoses:  De Quervain's tenosynovitis, right     Discharge Instructions     Wear the brace at all times and while sleeping  Take the prednisone for 5 days, stop over the counter medicines until you finish this Apply the cream to the area of pain, up to 4 times a day  Follow up with sports medicine group if not improving over the next 1 week      ED Prescriptions    Medication Sig Dispense Auth. Provider   predniSONE (DELTASONE) 10 MG tablet Take 4 tablets (40 mg total) by mouth daily for 5 days. 20 tablet Tikesha Mort, Marguerita Beards, PA-C   diclofenac Sodium (VOLTAREN) 1 % GEL Apply 4 g topically 4 (four) times daily. 100 g Jeffie Spivack, Marguerita Beards, PA-C     PDMP not reviewed this encounter.   Purnell Shoemaker, PA-C 04/03/20 1304

## 2020-04-03 NOTE — Discharge Instructions (Signed)
Wear the brace at all times and while sleeping  Take the prednisone for 5 days, stop over the counter medicines until you finish this Apply the cream to the area of pain, up to 4 times a day  Follow up with sports medicine group if not improving over the next 1 week

## 2020-04-03 NOTE — ED Triage Notes (Signed)
Pt c/o 2/10 sore sore pain in right hand and wrist. Pt states she believes it's from over use from work. 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 Pt arrived to exam room with wrist brace on. Pt states the pain is worse when she has to move her thumb. Pt has 2+ right radial wrist, trace swelling of right hand.

## 2020-05-03 ENCOUNTER — Encounter: Payer: Self-pay | Admitting: Family Medicine

## 2020-05-03 ENCOUNTER — Ambulatory Visit (INDEPENDENT_AMBULATORY_CARE_PROVIDER_SITE_OTHER): Payer: 59 | Admitting: Family Medicine

## 2020-05-03 ENCOUNTER — Other Ambulatory Visit: Payer: Self-pay

## 2020-05-03 VITALS — BP 108/76 | Ht 61.0 in | Wt 148.0 lb

## 2020-05-03 DIAGNOSIS — M654 Radial styloid tenosynovitis [de Quervain]: Secondary | ICD-10-CM

## 2020-05-03 MED ORDER — METHYLPREDNISOLONE ACETATE 40 MG/ML IJ SUSP
20.0000 mg | Freq: Once | INTRAMUSCULAR | Status: AC
  Administered 2020-05-03: 20 mg via INTRA_ARTICULAR

## 2020-05-03 NOTE — Patient Instructions (Signed)
You have deQuervain's tenosynovitis of your thumb/wrist. Avoid painful activities as much as possible. Wear the thumb spica brace as often as possible to rest this for the next weeks. Ice 15 minutes at a time 3-4 times a day. A cortisone injection typically helps a great deal with this and is an option - you were given this today. Follow up with me in 1 month for reevaluation but call me sooner if you're struggling and we will extend your work note.

## 2020-05-03 NOTE — Progress Notes (Addendum)
PCP: Jane Eaton, No Pcp Per  Subjective:   HPI: Jane Eaton is a 44 y.o. female here for right wrist pain.  Ms. Dever states that approximately 1 month ago, she was working on an Academic librarian and stacking boxes when she had sharp pain in her right wrist.  She went to an urgent care at that time and was diagnosed with de Quervain's tenosynovitis and provided a 5-day course of prednisone.  She notes that she had slight relief in pain with the prednisone but no complete resolution.  She has since been wearing a thumb spica brace all day and at night.  Exacerbating factors include any twisting motions of the wrist and palpation.  She has continued to work at the [employer] but tries to wear her brace, but finds that her work significantly worsens her pain.  Alleviating factors include rest.  She denies any numbness or tingling in her fingers, weakness in her wrist or fingers, pain in her arm or forearm, trauma or fall.  Past Medical History:  Diagnosis Date  . Allergy   . Anemia   . Depression   . History of seizures   . Narcolepsy   . Sleep paralysis   . UTI (urinary tract infection)     Current Outpatient Medications on File Prior to Visit  Medication Sig Dispense Refill  . aspirin-acetaminophen-caffeine (EXCEDRIN MIGRAINE) 250-250-65 MG tablet Take 1 tablet by mouth every 6 (six) hours as needed for headache.    Marland Kitchen azithromycin (ZITHROMAX) 250 MG tablet Take 1 tablet (250 mg total) by mouth daily. Take first 2 tablets together, then 1 every day until finished. 6 tablet 0  . diclofenac Sodium (VOLTAREN) 1 % GEL Apply 4 g topically 4 (four) times daily. 100 g 0  . naproxen (NAPROSYN) 500 MG tablet Take 1 tablet (500 mg total) by mouth 2 (two) times daily. 30 tablet 0   No current facility-administered medications on file prior to visit.    Past Surgical History:  Procedure Laterality Date  . CESAREAN SECTION    . WISDOM TOOTH EXTRACTION      No Known Allergies  Social  History   Socioeconomic History  . Marital status: Married    Spouse name: Not on file  . Number of children: Not on file  . Years of education: Not on file  . Highest education level: Not on file  Occupational History  . Not on file  Tobacco Use  . Smoking status: Never Smoker  . Smokeless tobacco: Never Used  Vaping Use  . Vaping Use: Never used  Substance and Sexual Activity  . Alcohol use: No  . Drug use: No  . Sexual activity: Yes    Partners: Male    Birth control/protection: None  Other Topics Concern  . Not on file  Social History Narrative  . Not on file   Social Determinants of Health   Financial Resource Strain:   . Difficulty of Paying Living Expenses:   Food Insecurity:   . Worried About Charity fundraiser in the Last Year:   . Arboriculturist in the Last Year:   Transportation Needs:   . Film/video editor (Medical):   Marland Kitchen Lack of Transportation (Non-Medical):   Physical Activity:   . Days of Exercise per Week:   . Minutes of Exercise per Session:   Stress:   . Feeling of Stress :   Social Connections:   . Frequency of Communication with Friends and Family:   .  Frequency of Social Gatherings with Friends and Family:   . Attends Religious Services:   . Active Member of Clubs or Organizations:   . Attends Archivist Meetings:   Marland Kitchen Marital Status:   Intimate Partner Violence:   . Fear of Current or Ex-Partner:   . Emotionally Abused:   Marland Kitchen Physically Abused:   . Sexually Abused:     Family History  Problem Relation Age of Onset  . Hypertension Mother   . Diabetes Mother   . Alcohol abuse Father   . Heart disease Father   . Hyperlipidemia Father     BP 108/76   Ht 5\' 1"  (1.549 m)   Wt 148 lb (67.1 kg)   BMI 27.96 kg/m   Review of Systems: See HPI above.     Objective:  Physical Exam:  Gen: NAD, comfortable in exam room  Right wrist: No gross abnormality on inspection.  Significant pain with ulnar and radial deviation,  but particularly worse with ulnar deviation felt radial aspect of wrist.  No significant pain with flexion or extension.  Very tender to palpation along 1st dorsal compartment.  No TTP 1st CMC, carpal tunnel.  FROM thumb and wrist.  Strength 5/5 digits.  NVI distally.   Assessment & Plan:  1.  De Quervain's tenosynovitis: Jane Eaton previously treated with 5-day course of steroids and over-the-counter NSAIDs with minimal resolution in symptoms.  Discussed alternatives , including conservative management withcontinued NSAIDs and activity restriction versus steroid injection.  Ms. Giglia opted for steroid injection at this time.  Recommended continued use of brace and restricted activity at work for the next 1 week.  Icing, aleve or ibuprofen if needed.  After informed written consent, timeout was performed.  Jane Eaton was seated on exam table.  Area overlying right first dorsal compartment prepped with alcohol swab and injected with 0.5: 0.5 mL bupivacaine: Depo-Medrol.  Jane Eaton tolerated procedure well without immediate complications.

## 2020-05-31 ENCOUNTER — Encounter: Payer: Self-pay | Admitting: Family Medicine

## 2020-05-31 ENCOUNTER — Other Ambulatory Visit: Payer: Self-pay

## 2020-05-31 ENCOUNTER — Ambulatory Visit (INDEPENDENT_AMBULATORY_CARE_PROVIDER_SITE_OTHER): Payer: 59 | Admitting: Family Medicine

## 2020-05-31 VITALS — BP 108/74 | Ht 61.0 in | Wt 145.0 lb

## 2020-05-31 DIAGNOSIS — M25531 Pain in right wrist: Secondary | ICD-10-CM | POA: Diagnosis not present

## 2020-05-31 NOTE — Progress Notes (Signed)
PCP: Patient, No Pcp Per  Subjective:   HPI: Patient is a 44 y.o. female here for right wrist pain.  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  She went to an urgent care at that time and was diagnosed with de Quervain's tenosynovitis and provided a 5-day course of prednisone.  She notes that she had slight relief in pain with the prednisone but no complete resolution.  She has since been wearing a thumb spica brace all day and at night.  Exacerbating factors include any twisting motions of the wrist and palpation.  She has continued to work at the [employer] but tries to wear her brace, but finds that her work significantly worsens her pain.  Alleviating factors include rest.  She denies any numbness or tingling in her fingers, weakness in her wrist or fingers, pain in her arm or forearm, trauma or fall.  9/13: Patient reports she's doing well. Pain primarily dorsal aspect right wrist now, resolution of pain on radial aspect. Bothers mainly with full extension of the wrist. Overall much better. Not using thumb spica brace any longer. Also with some swelling left wrist with some pain dorsally.  Past Medical History:  Diagnosis Date  . Allergy   . Anemia   . Depression   . History of seizures   . Narcolepsy   . Sleep paralysis   . UTI (urinary tract infection)     Current Outpatient Medications on File Prior to Visit  Medication Sig Dispense Refill  . aspirin-acetaminophen-caffeine (EXCEDRIN MIGRAINE) 250-250-65 MG tablet Take 1 tablet by mouth every 6 (six) hours as needed for headache.    . ergocalciferol (VITAMIN D2) 1.25 MG (50000 UT) capsule ergocalciferol (vitamin D2) 1,250 mcg (50,000 unit) capsule     No current facility-administered medications on file prior to visit.    Past Surgical History:  Procedure Laterality Date  . CESAREAN SECTION     . WISDOM TOOTH EXTRACTION      No Known Allergies  Social History   Socioeconomic History  . Marital status: Married    Spouse name: Not on file  . Number of children: Not on file  . Years of education: Not on file  . Highest education level: Not on file  Occupational History  . Not on file  Tobacco Use  . Smoking status: Never Smoker  . Smokeless tobacco: Never Used  Vaping Use  . Vaping Use: Never used  Substance and Sexual Activity  . Alcohol use: No  . Drug use: No  . Sexual activity: Yes    Partners: Male    Birth control/protection: None  Other Topics Concern  . Not on file  Social History Narrative  . Not on file   Social Determinants of Health   Financial Resource Strain:   . Difficulty of Paying Living Expenses: Not on file  Food Insecurity:   . Worried About Charity fundraiser in the Last Year: Not on file  . Ran Out of Food in the Last Year: Not on file  Transportation Needs:   . Lack of Transportation (Medical): Not on file  . Lack of Transportation (Non-Medical): Not on file  Physical Activity:   . Days of Exercise per Week: Not on file  . Minutes of Exercise per Session: Not on file  Stress:   . Feeling of Stress : Not on  file  Social Connections:   . Frequency of Communication with Friends and Family: Not on file  . Frequency of Social Gatherings with Friends and Family: Not on file  . Attends Religious Services: Not on file  . Active Member of Clubs or Organizations: Not on file  . Attends Archivist Meetings: Not on file  . Marital Status: Not on file  Intimate Partner Violence:   . Fear of Current or Ex-Partner: Not on file  . Emotionally Abused: Not on file  . Physically Abused: Not on file  . Sexually Abused: Not on file    Family History  Problem Relation Age of Onset  . Hypertension Mother   . Diabetes Mother   . Alcohol abuse Father   . Heart disease Father   . Hyperlipidemia Father     BP 108/74   Ht 5\' 1"   (1.549 m)   Wt 145 lb (65.8 kg)   BMI 27.40 kg/m   Review of Systems: See HPI above.     Objective:  Physical Exam:  Gen: NAD, comfortable in exam room  Right wrist: No deformity. FROM with 5/5 strength.  Minimal pain with full extension. No tenderness to palpation. Negative finkelsteins. NVI distally.  Left wrist: Mild swelling dorsal wrist.  Minimal tenderness.   Assessment & Plan:  1. Right wrist pain - 2/2 dequervains tenosynovitis.  Resolved.  Now with pain more consistent with increased pressure on joint capsule with full extension.  Discussed wrist brace.  Icing, tylenol, ibuprofen if needed.  F/u prn.  2.  Left wrist synovial cyst - measures 51mm depth x 37mm length.  Encouraged conservative treatment.  Consider aspiration if this gets larger, more symptomatic.  She also has 2 lipomas left forearm - not interested in surgical intervention at this time - encouraged conservative treatment as well.

## 2020-05-31 NOTE — Patient Instructions (Signed)
Your dequervain's has resolved. The pain you're getting in your left wrist is due to strain on the capsule. You can get a standard wrist brace to help with this. Icing, tylenol, ibuprofen only if needed. If you want to have the lipomas removed call me and we can put in a referral to general surgery. If the cyst bothers you enough let me know and we can aspirate this but it's very small right now and most go away on their own. Follow up with me as needed otherwise.

## 2022-02-16 ENCOUNTER — Ambulatory Visit (HOSPITAL_COMMUNITY)
Admission: EM | Admit: 2022-02-16 | Discharge: 2022-02-16 | Disposition: A | Discharge status: Home / Self Care | Payer: Self-pay | Attending: Internal Medicine | Admitting: Internal Medicine

## 2022-02-16 ENCOUNTER — Encounter (HOSPITAL_COMMUNITY): Payer: Self-pay

## 2022-02-16 DIAGNOSIS — S93402A Sprain of unspecified ligament of left ankle, initial encounter: Secondary | ICD-10-CM

## 2022-02-16 MED ORDER — MELOXICAM 7.5 MG PO TABS: 7.5000 mg | ORAL_TABLET | Freq: Every day | ORAL | 0 refills | Status: DC

## 2022-02-16 NOTE — ED Triage Notes (Signed)
Pt presents with left ankle pain & swelling after an accident with a door at work X 1 month ago.

## 2022-02-16 NOTE — Discharge Instructions (Addendum)
Gentle range of motion exercises Icing of the left ankle after work Take medications as prescribed In shoe inserts will help with ankle pain Please use Tylenol Extra Strength as needed for pain Follow-up with urgent care or sports medicine if ankle pain is persistent

## 2022-02-16 NOTE — ED Provider Notes (Signed)
[place]    CSN: [id number] Arrival date & time: 02/16/22  3428      History   Chief Complaint Chief Complaint  Patient presents with  . Ankle Pain    HPI Jane Eaton is a 46 y.o. female comes to the urgent care with left ankle pain and swelling for 1 month duration.  Patient sustained an injury to the left ankle about a month ago.  She stated the pain initially got better and has persisted over the past few weeks.  Pain is sharp in nature and aggravated by bearing weight or keeping the ankle in certain positions.  It is associated with swelling especially towards the end of the day.  She stands for several hours a day.  No erythema.  Pain is nonradiating.  She has tried ibuprofen Tylenol with no significant improvement.   HPI  Past Medical History:  Diagnosis Date  . Allergy   . Anemia   . Depression   . History of seizures   . Narcolepsy   . Sleep paralysis   . UTI (urinary tract infection)     Patient Active Problem List   Diagnosis Date Noted  . VAGINITIS, CANDIDAL 04/08/2010  . URINALYSIS, ABNORMAL 04/05/2010  . 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 PERSISTENT DISORDER INIT/MAINTAINING WAKEFULNESS 03/31/2008  . FATIGUE 03/31/2008    Past Surgical History:  Procedure Laterality Date  . CESAREAN SECTION    . WISDOM TOOTH EXTRACTION      OB History   No obstetric history on file.      Home Medications    Prior to Admission medications   Medication Sig Start Date End Date Taking? Authorizing Provider  meloxicam (MOBIC) 7.5 MG tablet Take 1 tablet (7.5 mg total) by mouth daily. 02/16/22  Yes Chloeanne Poteet, Myrene Galas, MD  aspirin-acetaminophen-caffeine (EXCEDRIN MIGRAINE) [phone] MG tablet Take 1 tablet by mouth every 6 (six) hours as needed for headache.    [provider]  ergocalciferol (VITAMIN D2) 1.25 MG (50000 UT) capsule ergocalciferol (vitamin D2) 1,250 mcg (50,000 unit) capsule    [provider]     Family History Family History  Problem Relation Age of Onset  . Hypertension Mother   . Diabetes Mother   . Alcohol abuse Father   . Heart disease Father   . Hyperlipidemia Father     Social History Social History   Tobacco Use  . Smoking status: Never  . Smokeless tobacco: Never  Vaping Use  . Vaping Use: Never used  Substance Use Topics  . Alcohol use: No  . Drug use: No     Allergies   Patient has no known allergies.   Review of Systems Review of Systems  Respiratory: Negative.    Cardiovascular: Negative.   Gastrointestinal: Negative.   Musculoskeletal:  Positive for arthralgias and joint swelling. Negative for back pain, myalgias and neck pain.  Neurological: Negative.     Physical Exam Triage Vital Signs ED Triage Vitals  Enc Vitals Group     BP 02/16/22 0856 125/85     Pulse Rate 02/16/22 0856 76     Resp 02/16/22 0856 18     Temp 02/16/22 0856 98.2 F (36.8 C)     Temp Source 02/16/22 0856 Oral     SpO2 02/16/22 0856 98 %     Weight --      Height --      Head Circumference --      Peak Flow --  Pain Score 02/16/22 0858 7     Pain Loc --      Pain Edu? --      Excl. in [place]? --    No data found.  Updated Vital Signs BP 125/85 (BP Location: Left Arm)   Pulse 76   Temp 98.2 F (36.8 C) (Oral)   Resp 18   SpO2 98%   Visual Acuity Right Eye Distance:   Left Eye Distance:   Bilateral Distance:    Right Eye Near:   Left Eye Near:    Bilateral Near:     Physical Exam Vitals and nursing note reviewed.  Constitutional:      General: She is not in acute distress.    Appearance: She is not ill-appearing.  Cardiovascular:     Rate and Rhythm: Normal rate and regular rhythm.  Pulmonary:     Effort: Pulmonary effort is normal.     Breath sounds: Normal breath sounds.  Musculoskeletal:        General: Swelling and tenderness present. No deformity. Normal range of motion.  Skin:    General: Skin is warm.     Capillary Refill:  Capillary refill takes less than 2 seconds.     Findings: No erythema, lesion or rash.  Neurological:     General: No focal deficit present.     Mental Status: She is alert and oriented to person, place, and time.     UC Treatments / Results  Labs (all labs ordered are listed, but only abnormal results are displayed) Labs Reviewed - No data to display  EKG   Radiology No results found.  Procedures Procedures (including critical care time)  Medications Ordered in UC Medications - No data to display  Initial Impression / Assessment and Plan / UC Course  I have reviewed the triage vital signs and the nursing notes.  Pertinent labs & imaging results that were available during my care of the patient were reviewed by me and considered in my medical decision making (see chart for details).     Left Ankle Sprain:  Final Clinical Impressions(s) / UC Diagnoses   Final diagnoses:  Sprain of left ankle, unspecified ligament, initial encounter     Discharge Instructions      Gentle range of motion exercises Icing of the left ankle after work Take medications as prescribed In shoe inserts will help with ankle pain Please use Tylenol Extra Strength as needed for pain Follow-up with urgent care or sports medicine if ankle pain is persistent   ED Prescriptions     Medication Sig Dispense Auth. Provider   meloxicam (MOBIC) 7.5 MG tablet Take 1 tablet (7.5 mg total) by mouth daily. 30 tablet Malaiya Paczkowski, Myrene Galas, MD      PDMP not reviewed this encounter.

## 2022-03-16 ENCOUNTER — Ambulatory Visit (HOSPITAL_COMMUNITY)
Admission: EM | Admit: 2022-03-16 | Discharge: 2022-03-16 | Disposition: A | Discharge status: Home / Self Care | Payer: Self-pay | Attending: Physician Assistant | Admitting: Physician Assistant

## 2022-03-16 ENCOUNTER — Encounter (HOSPITAL_COMMUNITY): Payer: Self-pay | Admitting: Emergency Medicine

## 2022-03-16 DIAGNOSIS — J029 Acute pharyngitis, unspecified: Secondary | ICD-10-CM

## 2022-03-16 MED ORDER — LIDOCAINE VISCOUS HCL 2 % MT SOLN: OROMUCOSAL | 0 refills | Status: AC

## 2022-03-16 NOTE — ED Provider Notes (Signed)
[place]    CSN: [id number] Arrival date & time: 03/16/22  0803      History   Chief Complaint Chief Complaint  Patient presents with   Sore Throat    HPI Jane Eaton is a 46 y.o. female.   Complains of soreness to her throat and her mouth.  Patient reports that she suffers from a seizure disorder narcolepsy sleep apnea and sleep paralysis.  Patient reports she had a significant episode 3 days ago where she could not get up and her throat and mouth became very dry and irritated.  Patient reports discomfort in her throat has continued.  She is able to eat and drink patient denies any exposure to illness she has not had any fever or chills she denies any cough or congestion   Sore Throat    Past Medical History:  Diagnosis Date   Allergy    Anemia    Depression    History of seizures    Narcolepsy    Sleep paralysis    UTI (urinary tract infection)     Patient Active Problem List   Diagnosis Date Noted   VAGINITIS, CANDIDAL 04/08/2010   URINALYSIS, ABNORMAL 04/05/2010   NARCOLEPSY CONDS CLASS ELSW WITHOUT CATAPLEXY 01/27/2009   PERSISTENT DISORDER INIT/MAINTAINING WAKEFULNESS 03/31/2008   FATIGUE 03/31/2008    Past Surgical History:  Procedure Laterality Date   CESAREAN SECTION     WISDOM TOOTH EXTRACTION      OB History   No obstetric history on file.      Home Medications    Prior to Admission medications   Medication Sig Start Date End Date Taking? Authorizing Provider  magic mouthwash (lidocaine, diphenhydrAMINE, alum & mag hydroxide) suspension 10 ml po Swish gargle and swallow q 6 hours x 2 day then prn 03/16/22  Yes Fransico Meadow, PA-C  aspirin-acetaminophen-caffeine (EXCEDRIN MIGRAINE) [phone] MG tablet Take 1 tablet by mouth every 6 (six) hours as needed for headache.    [provider]  ergocalciferol (VITAMIN D2) 1.25 MG (50000 UT) capsule ergocalciferol (vitamin D2) 1,250 mcg (50,000 unit) capsule    [provider]  meloxicam (MOBIC) 7.5 MG tablet Take 1 tablet (7.5 mg total) by mouth daily. 02/16/22   Lamptey, Myrene Galas, MD    Family History Family History  Problem Relation Age of Onset   Hypertension Mother    Diabetes Mother    Alcohol abuse Father    Heart disease Father    Hyperlipidemia Father     Social History Social History   Tobacco Use   Smoking status: Never   Smokeless tobacco: Never  Vaping Use   Vaping Use: Never used  Substance Use Topics   Alcohol use: No   Drug use: No     Allergies   Patient has no known allergies.   Review of Systems Review of Systems  All other systems reviewed and are negative.    Physical Exam Triage Vital Signs ED Triage Vitals  Enc Vitals Group     BP 03/16/22 0822 (!) 143/86     Pulse Rate 03/16/22 0822 87     Resp 03/16/22 0822 18     Temp 03/16/22 0822 98.2 F (36.8 C)     Temp src --      SpO2 03/16/22 0822 95 %     Weight --      Height --      Head Circumference --      Peak Flow --  Pain Score 03/16/22 0821 6     Pain Loc --      Pain Edu? --      Excl. in [place]? --    No data found.  Updated Vital Signs BP (!) 143/86   Pulse 87   Temp 98.2 F (36.8 C)   Resp 18   LMP 01/27/2020 (Approximate)   SpO2 95%   Visual Acuity Right Eye Distance:   Left Eye Distance:   Bilateral Distance:    Right Eye Near:   Left Eye Near:    Bilateral Near:     Physical Exam Vitals and nursing note reviewed.  Constitutional:      Appearance: She is well-developed.  HENT:     Head: Normocephalic.     Right Ear: Tympanic membrane normal.     Left Ear: Tympanic membrane normal.     Nose: Congestion present.     Mouth/Throat:     Mouth: Mucous membranes are moist.  Pulmonary:     Effort: Pulmonary effort is normal.  Abdominal:     General: There is no distension.  Musculoskeletal:        General: Normal range of motion.     Cervical back: Normal range of motion.  Skin:    General: Skin is warm.   Neurological:     Mental Status: She is alert and oriented to person, place, and time.  Psychiatric:        Mood and Affect: Mood normal.      UC Treatments / Results  Labs (all labs ordered are listed, but only abnormal results are displayed) Labs Reviewed - No data to display  EKG   Radiology No results found.  Procedures Procedures (including critical care time)  Medications Ordered in UC Medications - No data to display  Initial Impression / Assessment and Plan / UC Course  I have reviewed the triage vital signs and the nursing notes.  Pertinent labs & imaging results that were available during my care of the patient were reviewed by me and considered in my medical decision making (see chart for details).     MDM I discussed with patient her symptoms she is advised to follow-up with her neurologist for recheck.  I do not feel like she needs any treatment for infectious etiology.  I will give her a prescription for Magic mouthwash to see if this helps with her symptoms Final Clinical Impressions(s) / UC Diagnoses   Final diagnoses:  Pharyngitis, unspecified etiology     Discharge Instructions      Return if any problems.     ED Prescriptions     Medication Sig Dispense Auth. Provider   magic mouthwash (lidocaine, diphenhydrAMINE, alum & mag hydroxide) suspension 10 ml po Swish gargle and swallow q 6 hours x 2 day then prn 200 mL Fransico Meadow, PA-C      PDMP not reviewed this encounter. An After Visit Summary was printed and given to the patient.    Fransico Meadow, [place] 03/16/22 1350

## 2022-03-16 NOTE — ED Triage Notes (Signed)
Pt is present today with concerns for a sore throat that started Monday

## 2022-03-16 NOTE — Discharge Instructions (Signed)
Return if any problems.

## 2022-08-02 ENCOUNTER — Encounter (HOSPITAL_COMMUNITY): Payer: Self-pay

## 2022-08-02 ENCOUNTER — Ambulatory Visit (INDEPENDENT_AMBULATORY_CARE_PROVIDER_SITE_OTHER): Payer: Self-pay

## 2022-08-02 ENCOUNTER — Ambulatory Visit (HOSPITAL_COMMUNITY)
Admission: EM | Admit: 2022-08-02 | Discharge: 2022-08-02 | Disposition: A | Discharge status: Home / Self Care | Payer: Self-pay | Attending: Internal Medicine | Admitting: Internal Medicine

## 2022-08-02 DIAGNOSIS — M25572 Pain in left ankle and joints of left foot: Secondary | ICD-10-CM

## 2022-08-02 DIAGNOSIS — G8929 Other chronic pain: Secondary | ICD-10-CM

## 2022-08-02 MED ORDER — MELOXICAM 15 MG PO TABS: 15.0000 mg | ORAL_TABLET | Freq: Every day | ORAL | 0 refills | Status: AC

## 2022-08-02 NOTE — ED Triage Notes (Signed)
Pt states she twisted her ankle in June at work and her lawyer told her to come in to be seen again for her lawsuit. States was seen in June and given antiinflammatories with no relief. States taking tylenol '650mg'$  daily with little relief.

## 2022-08-02 NOTE — Discharge Instructions (Addendum)
Take meloxicam once daily with food

## 2022-08-02 NOTE — ED Provider Notes (Signed)
[place]   [id number] 08/02/22 Arrival Time: 2633  ASSESSMENT & PLAN:  1. Chronic pain of left ankle    -this is a chronic problem with progression.  X-rays performed today were negative for fracture.  I encouraged her to follow-up with her primary care physician to discuss formal physical therapy to help regain some strength and range of motion in the ankle.  Encouraged use of a lace up ankle brace while at work if possible.  I will prescribe more meloxicam to take once daily with food as an anti-inflammatory.  All questions were answered and she agrees to plan.  Meds ordered this encounter  Medications   meloxicam (MOBIC) 15 MG tablet    Sig: Take 1 tablet (15 mg total) by mouth daily.    Dispense:  30 tablet    Refill:  0   Discharge Instructions   None       Reviewed expectations re: course of current medical issues. Questions answered. Outlined signs and symptoms indicating need for more acute intervention. Patient verbalized understanding. After Visit Summary given.   SUBJECTIVE: Pleasant 46 year old female comes to urgent care to be evaluated for left ankle pain.  She suffered an injury at work back in April.  The door slammed against her and caused her to twist her left ankle.  She has an ongoing Sport and exercise psychologist. case.  She has been trying to treat this ankle conservatively but is still walking with a limp.  She has tried wearing an ankle brace and taking anti-inflammatories such as meloxicam but has not gotten any better. In fact she thinks it might be getting worse.  She is here for further evaluation before meeting with her lawyer next month.  No LMP recorded. (Menstrual status: Irregular Periods). Past Surgical History:  Procedure Laterality Date   CESAREAN SECTION     WISDOM TOOTH EXTRACTION       OBJECTIVE:  Vitals:   08/02/22 0824  BP: (!) 142/86  Pulse: 73  Resp: 18  Temp: 98 F (36.7 C)  TempSrc: Oral  SpO2: 100%     Physical  Exam Vitals reviewed.  Constitutional:      General: She is not in acute distress.    Appearance: Normal appearance.  Cardiovascular:     Rate and Rhythm: Normal rate.  Pulmonary:     Effort: Pulmonary effort is normal.  Musculoskeletal:     Comments: L ankle -there is some mild soft tissue swelling over the lateral aspect of the ankle.  There is no overlying erythema or ecchymoses.  She is tender to palpation over the posterior tip of the lateral malleolus.  She is nontender to palpation over the medial malleolus, navicular, base of fifth metatarsal.  Range of motion of flexion and extension and external and internal rotation is limited to only a few degrees due to pain.  Strength 4/5 in all directions.  She has a negative anterior drawer test.  She has a positive talar tilt test.  Positive Klieger test.  Neurovascularly intact distally.  Neurological:     Mental Status: She is alert.      Labs: Results for orders placed or performed during the hospital encounter of 04/03/20  POC urine pregnancy  Result Value Ref Range   Preg Test, Ur NEGATIVE NEGATIVE   Labs Reviewed - No data to display  Imaging: DG Ankle Complete Left  Result Date: 08/02/2022 CLINICAL DATA:  Pain. EXAM: LEFT ANKLE COMPLETE - 3+ VIEW COMPARISON:  None Available. FINDINGS:  There is no evidence of fracture, dislocation, or joint effusion. There is no evidence of arthropathy or other focal bone abnormality. Plantar calcaneal spurring. Soft tissues are unremarkable. IMPRESSION: 1. No acute findings. 2. Plantar calcaneal spurring. Electronically Signed   By: Keane Police D.O.   On: 08/02/2022 09:53     No Known Allergies                                             Past Medical History:  Diagnosis Date   Allergy    Anemia    Depression    History of seizures    Narcolepsy    Sleep paralysis    UTI (urinary tract infection)     Social History   Socioeconomic History   Marital status: Married    Spouse  name: Not on file   Number of children: Not on file   Years of education: Not on file   Highest education level: Not on file  Occupational History   Not on file  Tobacco Use   Smoking status: Never   Smokeless tobacco: Never  Vaping Use   Vaping Use: Never used  Substance and Sexual Activity   Alcohol use: No   Drug use: No   Sexual activity: Yes    Partners: Male    Birth control/protection: None  Other Topics Concern   Not on file  Social History Narrative   Not on file   Social Determinants of Health   Financial Resource Strain: Not on file  Food Insecurity: Not on file  Transportation Needs: Not on file  Physical Activity: Not on file  Stress: Not on file  Social Connections: Not on file  Intimate Partner Violence: Not on file    Family History  Problem Relation Age of Onset   Hypertension Mother    Diabetes Mother    Alcohol abuse Father    Heart disease Father    Hyperlipidemia Father       Tabitha Tupper, Dorian Pod, MD 08/02/22 1009

## 2022-08-29 ENCOUNTER — Other Ambulatory Visit: Payer: Self-pay | Admitting: Sports Medicine

## 2024-06-05 ENCOUNTER — Other Ambulatory Visit: Payer: Self-pay

## 2024-06-05 ENCOUNTER — Emergency Department (HOSPITAL_COMMUNITY)
Admission: EM | Admit: 2024-06-05 | Discharge: 2024-06-05 | Disposition: A | Discharge status: Home / Self Care | Payer: Self-pay | Attending: Emergency Medicine | Admitting: Emergency Medicine

## 2024-06-05 DIAGNOSIS — R569 Unspecified convulsions: Secondary | ICD-10-CM | POA: Insufficient documentation

## 2024-06-05 DIAGNOSIS — R410 Disorientation, unspecified: Secondary | ICD-10-CM | POA: Insufficient documentation

## 2024-06-05 LAB — CBC WITH DIFFERENTIAL/PLATELET
Abs Immature Granulocytes: 0.02 K/uL (ref 0.00–0.07)
Basophils Absolute: 0 K/uL (ref 0.0–0.1)
Basophils Relative: 0 %
Eosinophils Absolute: 0.1 K/uL (ref 0.0–0.5)
Eosinophils Relative: 1 %
HCT: 43.9 % (ref 36.0–46.0)
Hemoglobin: 13.2 g/dL (ref 12.0–15.0)
Immature Granulocytes: 0 %
Lymphocytes Relative: 42 %
Lymphs Abs: 2.8 K/uL (ref 0.7–4.0)
MCH: 25.2 pg — ABNORMAL LOW (ref 26.0–34.0)
MCHC: 30.1 g/dL (ref 30.0–36.0)
MCV: 83.8 fL (ref 80.0–100.0)
Monocytes Absolute: 0.3 K/uL (ref 0.1–1.0)
Monocytes Relative: 5 %
Neutro Abs: 3.5 K/uL (ref 1.7–7.7)
Neutrophils Relative %: 52 %
Platelets: 311 K/uL (ref 150–400)
RBC: 5.24 MIL/uL — ABNORMAL HIGH (ref 3.87–5.11)
RDW: 13.8 % (ref 11.5–15.5)
WBC: 6.7 K/uL (ref 4.0–10.5)
nRBC: 0 % (ref 0.0–0.2)

## 2024-06-05 LAB — COMPREHENSIVE METABOLIC PANEL WITH GFR
ALT: 31 U/L (ref 0–44)
AST: 29 U/L (ref 15–41)
Albumin: 4.4 g/dL (ref 3.5–5.0)
Alkaline Phosphatase: 140 U/L — ABNORMAL HIGH (ref 38–126)
Anion gap: 13 (ref 5–15)
BUN: 16 mg/dL (ref 6–20)
CO2: 26 mmol/L (ref 22–32)
Calcium: 9.8 mg/dL (ref 8.9–10.3)
Chloride: 104 mmol/L (ref 98–111)
Creatinine, Ser: 1.02 mg/dL — ABNORMAL HIGH (ref 0.44–1.00)
GFR, Estimated: >60 mL/min (ref >60)
Glucose, Bld: 72 mg/dL (ref 70–99)
Potassium: 4 mmol/L (ref 3.5–5.1)
Sodium: 143 mmol/L (ref 135–145)
Total Bilirubin: 0.4 mg/dL (ref 0.0–1.2)
Total Protein: 7.5 g/dL (ref 6.5–8.1)

## 2024-06-05 NOTE — ED Triage Notes (Signed)
 Pt is an employee here, and reports a seizure at 3AM today. Pt came to work and reports seeing colors, and flashing lights that began at approx 07:52. Pt reports a hx of nocturnal seizures, typically 4x weekly.

## 2024-06-05 NOTE — Discharge Instructions (Addendum)
 Follow-up with Dr. Chalice in the next month.  You may go back to work now

## 2024-06-05 NOTE — ED Provider Notes (Signed)
 [facility] AT [facility] Provider Note   CSN: [id number] Arrival date & time: 06/05/24  0831     Patient presents with: Seizures   Jane Eaton is a 48 y.o. female.   Patient with narcolepsy and sleep paralysis.  Patient states she has these seizures at night for a number years.  She has seen a neurologist and they do not recommend treatment right now.  Sometimes she is a little confused afterwards.  Patient was mildly confused today and her colleague sent her to the ED  The history is provided by the patient and medical records.  Seizures Seizure activity on arrival: no   Seizure type:  Unable to specify Preceding symptoms: no sensation of an aura present   Initial focality:  None Episode characteristics: no abnormal movements   Postictal symptoms: confusion   Return to baseline: yes   Severity:  Mild Timing:  Once Progression:  Resolved Context: not alcohol withdrawal        Prior to Admission medications   Medication Sig Start Date End Date Taking? Authorizing Provider  aspirin-acetaminophen-caffeine (EXCEDRIN MIGRAINE) 250-250-65 MG tablet Take 1 tablet by mouth every 6 (six) hours as needed for headache.    [provider]  ergocalciferol (VITAMIN D2) 1.25 MG (50000 UT) capsule ergocalciferol (vitamin D2) 1,250 mcg (50,000 unit) capsule    [provider]  magic mouthwash (lidocaine , diphenhydrAMINE , alum & mag hydroxide) suspension 10 ml po Swish gargle and swallow q 6 hours x 2 day then prn 03/16/22   Sofia, Leslie K, PA-C    Allergies: Patient has no known allergies.    Review of Systems  Constitutional:  Negative for appetite change and fatigue.  HENT:  Negative for congestion, ear discharge and sinus pressure.   Eyes:  Negative for discharge.  Respiratory:  Negative for cough.   Cardiovascular:  Negative for chest pain.  Gastrointestinal:  Negative for abdominal pain and diarrhea.  Genitourinary:   Negative for frequency and hematuria.  Musculoskeletal:  Negative for back pain.  Skin:  Negative for rash.  Neurological:  Positive for seizures. Negative for headaches.  Psychiatric/Behavioral:  Negative for hallucinations.     Updated Vital Signs BP (!) 145/99 (BP Location: Left Arm)   Pulse 84   Temp 98 F (36.7 C) (Oral)   Resp 16   Wt 66.7 kg   SpO2 100%   BMI 27.78 kg/m   Physical Exam Vitals and nursing note reviewed.  Constitutional:      Appearance: She is well-developed.  HENT:     Head: Normocephalic.     Nose: Nose normal.  Eyes:     General: No scleral icterus.    Conjunctiva/sclera: Conjunctivae normal.  Neck:     Thyroid : No thyromegaly.  Cardiovascular:     Rate and Rhythm: Normal rate and regular rhythm.     Heart sounds: No murmur heard.    No friction rub. No gallop.  Pulmonary:     Breath sounds: No stridor. No wheezing or rales.  Chest:     Chest wall: No tenderness.  Abdominal:     General: There is no distension.     Tenderness: There is no abdominal tenderness. There is no rebound.  Musculoskeletal:        General: Normal range of motion.     Cervical back: Neck supple.  Lymphadenopathy:     Cervical: No cervical adenopathy.  Skin:    Findings: No erythema or rash.  Neurological:  Mental Status: She is oriented to person, place, and time.     Motor: No abnormal muscle tone.     Coordination: Coordination normal.  Psychiatric:        Behavior: Behavior normal.     (all labs ordered are listed, but only abnormal results are displayed) Labs Reviewed  CBC WITH DIFFERENTIAL/PLATELET - Abnormal; Notable for the following components:      Result Value   RBC 5.24 (*)    MCH 25.2 (*)    All other components within normal limits  COMPREHENSIVE METABOLIC PANEL WITH GFR - Abnormal; Notable for the following components:   Creatinine, Ser 1.02 (*)    Alkaline Phosphatase 140 (*)    All other components within normal limits     EKG: None  Radiology: No results found.   Procedures   Medications Ordered in the ED - No data to display                                  Medical Decision Making Amount and/or Complexity of Data Reviewed Labs: ordered.   Narcolepsy and sleep paralysis and nighttime seizure activities.  Patient will follow back up with her neurologist.  Her symptoms have been stable for years     Final diagnoses:  None    ED Discharge Orders     None          Suzette Pac, MD 06/05/24 1825
